# Patient Record
Sex: Female | Born: 1950 | Race: White | Hispanic: No | Smoking: Former smoker
Health system: Southern US, Community
[De-identification: ages and names within clinical notes are randomized; demographics above are authoritative.]

## PROBLEM LIST (undated history)

## (undated) DIAGNOSIS — I219 Acute myocardial infarction, unspecified: Secondary | ICD-10-CM

## (undated) DIAGNOSIS — T7840XA Allergy, unspecified, initial encounter: Secondary | ICD-10-CM

## (undated) DIAGNOSIS — E042 Nontoxic multinodular goiter: Secondary | ICD-10-CM

## (undated) DIAGNOSIS — I471 Supraventricular tachycardia, unspecified: Secondary | ICD-10-CM

## (undated) DIAGNOSIS — T8859XA Other complications of anesthesia, initial encounter: Secondary | ICD-10-CM

## (undated) DIAGNOSIS — M1990 Unspecified osteoarthritis, unspecified site: Secondary | ICD-10-CM

## (undated) DIAGNOSIS — M810 Age-related osteoporosis without current pathological fracture: Secondary | ICD-10-CM

## (undated) DIAGNOSIS — C801 Malignant (primary) neoplasm, unspecified: Secondary | ICD-10-CM

## (undated) HISTORY — DX: Supraventricular tachycardia: I47.1

## (undated) HISTORY — PX: TONSILLECTOMY: SUR1361

## (undated) HISTORY — PX: MOHS SURGERY: SUR867

## (undated) HISTORY — DX: Supraventricular tachycardia, unspecified: I47.10

## (undated) HISTORY — DX: Unspecified osteoarthritis, unspecified site: M19.90

## (undated) HISTORY — DX: Allergy, unspecified, initial encounter: T78.40XA

## (undated) HISTORY — PX: COLONOSCOPY: SHX174

## (undated) HISTORY — DX: Age-related osteoporosis without current pathological fracture: M81.0

## (undated) HISTORY — PX: POLYPECTOMY: SHX149

## (undated) HISTORY — DX: Nontoxic multinodular goiter: E04.2

---

## 1998-02-18 ENCOUNTER — Other Ambulatory Visit: Admission: RE | Admit: 1998-02-18 | Discharge: 1998-02-18 | Payer: Self-pay | Admitting: *Deleted

## 1999-10-24 ENCOUNTER — Other Ambulatory Visit: Admission: RE | Admit: 1999-10-24 | Discharge: 1999-10-24 | Payer: Self-pay | Admitting: *Deleted

## 2000-10-04 ENCOUNTER — Encounter: Payer: Self-pay | Admitting: Family Medicine

## 2000-10-04 ENCOUNTER — Encounter: Admission: RE | Admit: 2000-10-04 | Discharge: 2000-10-04 | Payer: Self-pay | Admitting: Family Medicine

## 2005-06-07 ENCOUNTER — Other Ambulatory Visit: Admission: RE | Admit: 2005-06-07 | Discharge: 2005-06-07 | Payer: Self-pay | Admitting: Obstetrics and Gynecology

## 2007-06-05 ENCOUNTER — Ambulatory Visit: Payer: Self-pay | Admitting: Gastroenterology

## 2007-06-16 ENCOUNTER — Ambulatory Visit: Payer: Self-pay | Admitting: Gastroenterology

## 2007-12-16 ENCOUNTER — Encounter: Admission: RE | Admit: 2007-12-16 | Discharge: 2007-12-16 | Payer: Self-pay | Admitting: Obstetrics and Gynecology

## 2008-01-07 ENCOUNTER — Other Ambulatory Visit: Admission: RE | Admit: 2008-01-07 | Discharge: 2008-01-07 | Payer: Self-pay | Admitting: Interventional Radiology

## 2008-01-07 ENCOUNTER — Encounter: Admission: RE | Admit: 2008-01-07 | Discharge: 2008-01-07 | Payer: Self-pay | Admitting: Obstetrics and Gynecology

## 2008-01-07 ENCOUNTER — Encounter (INDEPENDENT_AMBULATORY_CARE_PROVIDER_SITE_OTHER): Payer: Self-pay | Admitting: Interventional Radiology

## 2009-09-10 ENCOUNTER — Ambulatory Visit: Payer: Self-pay | Admitting: Radiology

## 2009-09-10 ENCOUNTER — Emergency Department (HOSPITAL_BASED_OUTPATIENT_CLINIC_OR_DEPARTMENT_OTHER): Admission: EM | Admit: 2009-09-10 | Discharge: 2009-09-10 | Payer: Self-pay | Admitting: Emergency Medicine

## 2010-06-19 LAB — CBC
HCT: 47.1 % — ABNORMAL HIGH (ref 36.0–46.0)
Hemoglobin: 16.5 g/dL — ABNORMAL HIGH (ref 12.0–15.0)
MCHC: 35 g/dL (ref 30.0–36.0)
MCV: 102.1 fL — ABNORMAL HIGH (ref 78.0–100.0)
Platelets: 223 10*3/uL (ref 150–400)
RBC: 4.61 MIL/uL (ref 3.87–5.11)
RDW: 12 % (ref 11.5–15.5)
WBC: 7.3 10*3/uL (ref 4.0–10.5)

## 2010-06-19 LAB — BASIC METABOLIC PANEL
BUN: 14 mg/dL (ref 6–23)
CO2: 25 mEq/L (ref 19–32)
Calcium: 9.9 mg/dL (ref 8.4–10.5)
Chloride: 105 mEq/L (ref 96–112)
Creatinine, Ser: 0.6 mg/dL (ref 0.4–1.2)
GFR calc Af Amer: >60 mL/min (ref >60)
GFR calc non Af Amer: >60 mL/min (ref >60)
Glucose, Bld: 140 mg/dL — ABNORMAL HIGH (ref 70–99)
Potassium: 4.6 mEq/L (ref 3.5–5.1)
Sodium: 143 mEq/L (ref 135–145)

## 2010-06-19 LAB — POCT CARDIAC MARKERS
CKMB, poc: 1.8 ng/mL (ref 1.0–8.0)
Troponin i, poc: <0.05 ng/mL (ref 0.00–0.09)

## 2010-06-19 LAB — DIFFERENTIAL
Basophils Absolute: 0.1 10*3/uL (ref 0.0–0.1)
Eosinophils Relative: 1 % (ref 0–5)
Lymphocytes Relative: 36 % (ref 12–46)
Lymphs Abs: 2.6 10*3/uL (ref 0.7–4.0)
Neutro Abs: 4 10*3/uL (ref 1.7–7.7)

## 2011-02-14 ENCOUNTER — Other Ambulatory Visit: Payer: Self-pay | Admitting: Obstetrics and Gynecology

## 2013-07-07 ENCOUNTER — Telehealth: Payer: Self-pay | Admitting: Gastroenterology

## 2013-07-07 ENCOUNTER — Encounter: Payer: Self-pay | Admitting: Gastroenterology

## 2013-07-07 NOTE — Telephone Encounter (Signed)
Per procedure note, she is a 5 year recall so she is due for colonoscopy. Please, schedule patient.

## 2013-07-07 NOTE — Telephone Encounter (Signed)
Left a message for Lillie Columbia to help with last procedure note.

## 2013-07-07 NOTE — Telephone Encounter (Signed)
Pt scheduled with Dr.Jacobs

## 2013-07-08 NOTE — Telephone Encounter (Signed)
Pathology report has been scanned and is available for view.  According to pathology report pt had hyperplastic polyps and is not due for colonoscopy until 2019.  Pt notified.  Colonoscopy for 07/21/13 cancelled.  Recall for 2019 entered into EPIC

## 2013-07-21 ENCOUNTER — Encounter: Payer: Self-pay | Admitting: Gastroenterology

## 2014-02-12 ENCOUNTER — Ambulatory Visit (INDEPENDENT_AMBULATORY_CARE_PROVIDER_SITE_OTHER): Payer: BC Managed Care – PPO | Admitting: Cardiology

## 2014-02-12 ENCOUNTER — Encounter: Payer: Self-pay | Admitting: Cardiology

## 2014-02-12 ENCOUNTER — Encounter: Payer: Self-pay | Admitting: *Deleted

## 2014-02-12 VITALS — BP 150/85 | HR 62 | Ht 66.5 in | Wt 103.2 lb

## 2014-02-12 DIAGNOSIS — R002 Palpitations: Secondary | ICD-10-CM

## 2014-02-12 DIAGNOSIS — I471 Supraventricular tachycardia: Secondary | ICD-10-CM

## 2014-02-12 DIAGNOSIS — Z720 Tobacco use: Secondary | ICD-10-CM

## 2014-02-12 DIAGNOSIS — Z87891 Personal history of nicotine dependence: Secondary | ICD-10-CM | POA: Insufficient documentation

## 2014-02-12 MED ORDER — METOPROLOL SUCCINATE ER 25 MG PO TB24: 25.0000 mg | ORAL_TABLET | Freq: Every day | ORAL | Status: DC

## 2014-02-12 NOTE — Progress Notes (Signed)
     HPI: 63 year old female for evaluation of palpitations. The patient was seen by Dr. Rollene Fare in June 2011 following an episode of palpitations requiring evaluation in the emergency room. There were apparently no rhythm strips available and he felt as though she may be having SVT. Echocardiogram in July 2011 showed normal LV function. There was mild tricuspid regurgitation and trace mitral regurgitation. Nuclear study in July 2011 showed an ejection fraction of 72%. There was no ischemia or infarction. Patient has had 2 episodes of heart racing similar to her previous event in the past 4 years. However recently she notes an elevated heart rate and also feeling her heart beat. She denies dyspnea on exertion, orthopnea, PND, pedal edema, syncope or chest pain.  Current Outpatient Prescriptions  Medication Sig Dispense Refill  . aspirin 81 MG tablet Take 81 mg by mouth daily.    . Calcium Carbonate-Vitamin D (CALTRATE 600+D PO) Take 1 capsule by mouth daily.    . Coenzyme Q10 (COQ10 PO) Take 10 mLs by mouth daily.    . metoprolol tartrate (LOPRESSOR) 25 MG tablet Take 25 mg by mouth as needed.    . Milk Thistle 200 MG CAPS Take 1 capsule by mouth 2 (two) times daily.    . Multiple Vitamins-Minerals (CENTRUM SILVER ADULT 50+ PO) Take 1 tablet by mouth daily.    . Omega-3 Fatty Acids (FISH OIL) 1200 MG CAPS Take 1 capsule by mouth daily.     No current facility-administered medications for this visit.    No Known Allergies   Past Medical History  Diagnosis Date  . Multiple thyroid nodules   . SVT (supraventricular tachycardia)     Past Surgical History  Procedure Laterality Date  . Tonsillectomy      History   Social History  . Marital Status: Married    Spouse Name: N/A    Number of Children: N/A  . Years of Education: N/A   Occupational History  . Not on file.   Social History Main Topics  . Smoking status: Current Some Day Smoker  . Smokeless tobacco: Not on file  .  Alcohol Use: 0.0 oz/week    0 Not specified per week     Comment: 2 glasses per night  . Drug Use: Not on file  . Sexual Activity: Not on file   Other Topics Concern  . Not on file   Social History Narrative  . No narrative on file    Family History  Problem Relation Age of Onset  . Heart disease Mother     Stents    ROS: no fevers or chills, productive cough, hemoptysis, dysphasia, odynophagia, melena, hematochezia, dysuria, hematuria, rash, seizure activity, orthopnea, PND, pedal edema, claudication. Remaining systems are negative.  Physical Exam:   Blood pressure 150/85, pulse 62, height 5' 6.5" (1.689 m), weight 103 lb 3.2 oz (46.811 kg).  General:  Well developed/well nourished in NAD Skin warm/dry Patient not depressed No peripheral clubbing Back-normal HEENT-normal/normal eyelids Neck supple/normal carotid upstroke bilaterally; no bruits; no JVD; no thyromegaly chest - Diminished BS CV - RRR/normal S1 and S2; no murmurs, rubs or gallops;  PMI nondisplaced Abdomen -NT/ND, no HSM, no mass, + bowel sounds, no bruit 2+ femoral pulses, no bruits Ext-no edema, chords, 2+ DP Neuro-grossly nonfocal  ECG NSR with no ST changes, Occasional PAC.

## 2014-02-12 NOTE — Assessment & Plan Note (Signed)
Add Toprol 25 mg daily.

## 2014-02-12 NOTE — Assessment & Plan Note (Signed)
Patient counseled on discontinuing. 

## 2014-02-12 NOTE — Patient Instructions (Signed)
Your physician recommends that you schedule a follow-up appointment in: [place] has requested that you have an echocardiogram. Echocardiography is a painless test that uses sound waves to create images of your heart. It provides your doctor with information about the size and shape of your heart and how well your heart's chambers and valves are working. This procedure takes approximately one hour. There are no restrictions for this procedure.   Your physician recommends that you HAVE LAB WORK TODAY  START METOPROLOL SUCC ER 25 MG ONCE DAILY AT BEDTIME

## 2014-02-12 NOTE — Assessment & Plan Note (Signed)
Etiology of symptoms not completely clear. Her symptoms at present are not similar to her previous SVT symptoms. Her SVT symptoms appear to be rare. I will add Toprol 25 mg daily. Check TSH and potassium. Repeat echocardiogram. I will see her back in 12 weeks. If her symptoms persist despite the addition of Toprol we will plan a monitor to further assess. If SVT demonstrated and continues despite beta-blockade we could consider ablation.

## 2014-02-13 LAB — BASIC METABOLIC PANEL WITH GFR
BUN: 19 mg/dL (ref 6–23)
CO2: 29 meq/L (ref 19–32)
Calcium: 10.1 mg/dL (ref 8.4–10.5)
Chloride: 102 mEq/L (ref 96–112)
Creat: 0.79 mg/dL (ref 0.50–1.10)
GFR, Est African American: >89 mL/min
GFR, Est Non African American: 79 mL/min
GLUCOSE: 98 mg/dL (ref 70–99)
POTASSIUM: 4.5 meq/L (ref 3.5–5.3)
SODIUM: 139 meq/L (ref 135–145)

## 2014-02-13 LAB — TSH: TSH: 4.257 u[IU]/mL (ref 0.350–4.500)

## 2014-02-17 ENCOUNTER — Ambulatory Visit (HOSPITAL_COMMUNITY): Payer: BC Managed Care – PPO

## 2014-03-12 DIAGNOSIS — Z1289 Encounter for screening for malignant neoplasm of other sites: Secondary | ICD-10-CM | POA: Diagnosis not present

## 2014-03-12 DIAGNOSIS — Z1231 Encounter for screening mammogram for malignant neoplasm of breast: Secondary | ICD-10-CM | POA: Diagnosis not present

## 2014-03-12 DIAGNOSIS — Z01419 Encounter for gynecological examination (general) (routine) without abnormal findings: Secondary | ICD-10-CM | POA: Diagnosis not present

## 2014-03-16 ENCOUNTER — Ambulatory Visit (HOSPITAL_COMMUNITY)
Admission: RE | Admit: 2014-03-16 | Discharge: 2014-03-16 | Disposition: A | Discharge status: Home / Self Care | Payer: Medicare Other | Source: Ambulatory Visit | Attending: Cardiology | Admitting: Cardiology

## 2014-03-16 DIAGNOSIS — I471 Supraventricular tachycardia: Secondary | ICD-10-CM | POA: Diagnosis not present

## 2014-03-16 DIAGNOSIS — I4949 Other premature depolarization: Secondary | ICD-10-CM | POA: Diagnosis not present

## 2014-03-16 DIAGNOSIS — R002 Palpitations: Secondary | ICD-10-CM

## 2014-03-16 NOTE — Progress Notes (Signed)
2D Echo Performed 03/16/2014    Matthew Cina, RCS  

## 2014-04-21 DIAGNOSIS — D440 Neoplasm of uncertain behavior of thyroid gland: Secondary | ICD-10-CM | POA: Diagnosis not present

## 2014-04-22 ENCOUNTER — Other Ambulatory Visit (INDEPENDENT_AMBULATORY_CARE_PROVIDER_SITE_OTHER): Payer: Self-pay | Admitting: Otolaryngology

## 2014-04-22 DIAGNOSIS — E041 Nontoxic single thyroid nodule: Secondary | ICD-10-CM

## 2014-04-29 ENCOUNTER — Ambulatory Visit (HOSPITAL_COMMUNITY): Payer: Medicare Other

## 2014-05-03 ENCOUNTER — Ambulatory Visit (HOSPITAL_COMMUNITY)
Admission: RE | Admit: 2014-05-03 | Discharge: 2014-05-03 | Disposition: A | Discharge status: Home / Self Care | Payer: Medicare Other | Source: Ambulatory Visit | Attending: Otolaryngology | Admitting: Otolaryngology

## 2014-05-03 DIAGNOSIS — E041 Nontoxic single thyroid nodule: Secondary | ICD-10-CM | POA: Diagnosis present

## 2014-05-03 DIAGNOSIS — E042 Nontoxic multinodular goiter: Secondary | ICD-10-CM | POA: Insufficient documentation

## 2014-05-20 NOTE — Progress Notes (Signed)
      HPI: FU palpitations. The patient was seen by Dr. Rollene Fare in June 2011 following an episode of palpitations requiring evaluation in the emergency room. There were apparently no rhythm strips available and he felt as though she may be having SVT. Nuclear study in July 2011 showed an ejection fraction of 72%. There was no ischemia or infarction. Echo 12/15 showed normal LV function. Since last seen,  Her palpitations have completely resolved. She denies dyspnea, chest pain, palpitations or syncope.  Current Outpatient Prescriptions  Medication Sig Dispense Refill  . aspirin 81 MG tablet Take 81 mg by mouth daily.    . Calcium Carbonate-Vitamin D (CALTRATE 600+D PO) Take 1 capsule by mouth daily.    . Coenzyme Q10 (COQ10 PO) Take 10 mLs by mouth daily.    . metoprolol succinate (TOPROL XL) 25 MG 24 hr tablet Take 1 tablet (25 mg total) by mouth daily. 90 tablet 3  . Milk Thistle 200 MG CAPS Take 1 capsule by mouth 2 (two) times daily.    . Multiple Vitamins-Minerals (CENTRUM SILVER ADULT 50+ PO) Take 1 tablet by mouth daily.    . Omega-3 Fatty Acids (FISH OIL) 1200 MG CAPS Take 1 capsule by mouth daily.     No current facility-administered medications for this visit.     Past Medical History  Diagnosis Date  . Multiple thyroid nodules   . SVT (supraventricular tachycardia)     Past Surgical History  Procedure Laterality Date  . Tonsillectomy      History   Social History  . Marital Status: Married    Spouse Name: N/A  . Number of Children: N/A  . Years of Education: N/A   Occupational History  . Not on file.   Social History Main Topics  . Smoking status: Current Some Day Smoker  . Smokeless tobacco: Not on file  . Alcohol Use: 0.0 oz/week    0 Standard drinks or equivalent per week     Comment: 2 glasses per night  . Drug Use: Not on file  . Sexual Activity: Not on file   Other Topics Concern  . Not on file   Social History Narrative    ROS: no fevers or  chills, productive cough, hemoptysis, dysphasia, odynophagia, melena, hematochezia, dysuria, hematuria, rash, seizure activity, orthopnea, PND, pedal edema, claudication. Remaining systems are negative.  Physical Exam: Well-developed well-nourished in no acute distress.  Skin is warm and dry.  HEENT is normal.  Neck is supple.  Chest is clear to auscultation with normal expansion.  Cardiovascular exam is regular rate and rhythm.  Abdominal exam nontender or distended. No masses palpated. Positive bruit Extremities show no edema. neuro grossly intact

## 2014-05-21 ENCOUNTER — Ambulatory Visit (INDEPENDENT_AMBULATORY_CARE_PROVIDER_SITE_OTHER): Payer: Medicare Other | Admitting: Cardiology

## 2014-05-21 ENCOUNTER — Encounter: Payer: Self-pay | Admitting: *Deleted

## 2014-05-21 ENCOUNTER — Encounter: Payer: Self-pay | Admitting: Cardiology

## 2014-05-21 VITALS — BP 138/78 | HR 68 | Ht 66.5 in | Wt 103.7 lb

## 2014-05-21 DIAGNOSIS — R0989 Other specified symptoms and signs involving the circulatory and respiratory systems: Secondary | ICD-10-CM

## 2014-05-21 DIAGNOSIS — I471 Supraventricular tachycardia: Secondary | ICD-10-CM

## 2014-05-21 DIAGNOSIS — Z720 Tobacco use: Secondary | ICD-10-CM | POA: Diagnosis not present

## 2014-05-21 NOTE — Assessment & Plan Note (Signed)
Schedule ultrasound to exclude aneurysm. 

## 2014-05-21 NOTE — Assessment & Plan Note (Signed)
Symptoms have resolved. Continue beta blocker. We will consider a monitor in the future if her symptoms recur.

## 2014-05-21 NOTE — Assessment & Plan Note (Signed)
Patient counseled on discontinuing. 

## 2014-05-21 NOTE — Assessment & Plan Note (Signed)
Continue beta blocker. 

## 2014-05-21 NOTE — Patient Instructions (Signed)
Your physician wants you to follow-up in: ONE YEAR WITH DR CRENSHAW You will receive a reminder letter in the mail two months in advance. If you don't receive a letter, please call our office to schedule the follow-up appointment.   Your physician has requested that you have an abdominal aorta duplex. During this test, an ultrasound is used to evaluate the aorta. Allow 30 minutes for this exam. Do not eat after midnight the day before and avoid carbonated beverages  

## 2014-06-09 ENCOUNTER — Ambulatory Visit (HOSPITAL_COMMUNITY)
Admission: RE | Admit: 2014-06-09 | Discharge: 2014-06-09 | Disposition: A | Discharge status: Home / Self Care | Payer: Medicare Other | Source: Ambulatory Visit | Attending: Internal Medicine | Admitting: Internal Medicine

## 2014-06-09 DIAGNOSIS — R0989 Other specified symptoms and signs involving the circulatory and respiratory systems: Secondary | ICD-10-CM

## 2014-06-09 NOTE — Progress Notes (Signed)
Abdominal Aortic Duplex Completed °Brianna L Mazza,RVT °

## 2014-06-14 ENCOUNTER — Encounter: Payer: Self-pay | Admitting: Cardiology

## 2014-06-14 NOTE — Telephone Encounter (Signed)
Pt is returning Debra's call in reference to some results to her ultrasound. Please f/u  Thanks

## 2014-06-14 NOTE — Telephone Encounter (Signed)
This encounter was created in error - please disregard.

## 2014-10-21 DIAGNOSIS — L821 Other seborrheic keratosis: Secondary | ICD-10-CM | POA: Diagnosis not present

## 2015-01-28 ENCOUNTER — Other Ambulatory Visit: Payer: Self-pay | Admitting: Cardiology

## 2015-04-06 ENCOUNTER — Other Ambulatory Visit (INDEPENDENT_AMBULATORY_CARE_PROVIDER_SITE_OTHER): Payer: Self-pay | Admitting: Otolaryngology

## 2015-04-06 DIAGNOSIS — E041 Nontoxic single thyroid nodule: Secondary | ICD-10-CM

## 2015-04-06 DIAGNOSIS — D440 Neoplasm of uncertain behavior of thyroid gland: Secondary | ICD-10-CM | POA: Diagnosis not present

## 2015-04-08 ENCOUNTER — Ambulatory Visit
Admission: RE | Admit: 2015-04-08 | Discharge: 2015-04-08 | Disposition: A | Discharge status: Home / Self Care | Payer: Medicare Other | Source: Ambulatory Visit | Attending: Otolaryngology | Admitting: Otolaryngology

## 2015-04-08 DIAGNOSIS — E042 Nontoxic multinodular goiter: Secondary | ICD-10-CM | POA: Diagnosis not present

## 2015-04-08 DIAGNOSIS — E041 Nontoxic single thyroid nodule: Secondary | ICD-10-CM

## 2015-04-11 DIAGNOSIS — Z1231 Encounter for screening mammogram for malignant neoplasm of breast: Secondary | ICD-10-CM | POA: Diagnosis not present

## 2015-04-11 DIAGNOSIS — Z1289 Encounter for screening for malignant neoplasm of other sites: Secondary | ICD-10-CM | POA: Diagnosis not present

## 2015-04-19 DIAGNOSIS — M810 Age-related osteoporosis without current pathological fracture: Secondary | ICD-10-CM | POA: Diagnosis not present

## 2015-04-19 DIAGNOSIS — M8588 Other specified disorders of bone density and structure, other site: Secondary | ICD-10-CM | POA: Diagnosis not present

## 2015-05-18 DIAGNOSIS — M810 Age-related osteoporosis without current pathological fracture: Secondary | ICD-10-CM | POA: Insufficient documentation

## 2015-05-30 NOTE — Progress Notes (Signed)
      HPI: FU palpitations. The patient was seen by Dr. Rollene Fare in June 2011 following an episode of palpitations requiring evaluation in the emergency room. There were apparently no rhythm strips available and he felt as though she may be having SVT. Nuclear study in July 2011 showed an ejection fraction of 72%. There was no ischemia or infarction. Echo 12/15 showed normal LV function. Abdominal ultrasound March 2016 showed no aneurysm. Since last seen,the patient denies any dyspnea on exertion, orthopnea, PND, pedal edema, palpitations, syncope or chest pain.   Current Outpatient Prescriptions  Medication Sig Dispense Refill  . aspirin 81 MG tablet Take 81 mg by mouth daily.    . Calcium Carbonate-Vitamin D (CALTRATE 600+D PO) Take 1 capsule by mouth daily.    . Coenzyme Q10 (COQ10 PO) Take 10 mLs by mouth daily.    . metoprolol succinate (TOPROL-XL) 25 MG 24 hr tablet TAKE 1 TABLET BY MOUTH DAILY 90 tablet 1  . Milk Thistle 200 MG CAPS Take 1 capsule by mouth 2 (two) times daily.    . Multiple Vitamins-Minerals (CENTRUM SILVER ADULT 50+ PO) Take 1 tablet by mouth daily.    . Omega-3 Fatty Acids (FISH OIL) 1200 MG CAPS Take 1 capsule by mouth daily.    . Turmeric 500 MG TABS Take 1 tablet by mouth daily.     No current facility-administered medications for this visit.     Past Medical History  Diagnosis Date  . Multiple thyroid nodules   . SVT (supraventricular tachycardia) [facility])     Past Surgical History  Procedure Laterality Date  . Tonsillectomy      Social History   Social History  . Marital Status: Married    Spouse Name: N/A  . Number of Children: N/A  . Years of Education: N/A   Occupational History  . Not on file.   Social History Main Topics  . Smoking status: Current Some Day Smoker  . Smokeless tobacco: Not on file  . Alcohol Use: 0.0 oz/week    0 Standard drinks or equivalent per week     Comment: 2 glasses per night  . Drug Use: Not on file  .  Sexual Activity: Not on file   Other Topics Concern  . Not on file   Social History Narrative    Family History  Problem Relation Age of Onset  . Heart disease Mother     Stents    ROS: no fevers or chills, productive cough, hemoptysis, dysphasia, odynophagia, melena, hematochezia, dysuria, hematuria, rash, seizure activity, orthopnea, PND, pedal edema, claudication. Remaining systems are negative.  Physical Exam: Well-developed well-nourished in no acute distress.  Skin is warm and dry.  HEENT is normal.  Neck is supple.  Chest diminished BS  Cardiovascular exam is regular rate and rhythm.  Abdominal exam nontender or distended. No masses palpated. Extremities show no edema. neuro grossly intact  ECG Sinus rhythm with PACs. Cannot rule out septal infarct.

## 2015-05-31 ENCOUNTER — Encounter: Payer: Self-pay | Admitting: Cardiology

## 2015-05-31 ENCOUNTER — Ambulatory Visit (INDEPENDENT_AMBULATORY_CARE_PROVIDER_SITE_OTHER): Payer: Medicare Other | Admitting: Cardiology

## 2015-05-31 VITALS — BP 130/78 | HR 54 | Ht 66.5 in | Wt 107.6 lb

## 2015-05-31 DIAGNOSIS — R002 Palpitations: Secondary | ICD-10-CM

## 2015-05-31 DIAGNOSIS — Z720 Tobacco use: Secondary | ICD-10-CM

## 2015-05-31 MED ORDER — METOPROLOL SUCCINATE ER 25 MG PO TB24: 25.0000 mg | ORAL_TABLET | Freq: Every day | ORAL | Status: DC

## 2015-05-31 NOTE — Assessment & Plan Note (Signed)
Symptoms controlled. Continue beta blocker.

## 2015-05-31 NOTE — Assessment & Plan Note (Signed)
Continue beta blocker. 

## 2015-05-31 NOTE — Assessment & Plan Note (Signed)
Patient counseled on discontinuing. 

## 2015-05-31 NOTE — Patient Instructions (Signed)
Your physician wants you to follow-up in: ONE YEAR WITH DR CRENSHAW You will receive a reminder letter in the mail two months in advance. If you don't receive a letter, please call our office to schedule the follow-up appointment.   If you need a refill on your cardiac medications before your next appointment, please call your pharmacy.  

## 2015-07-01 DIAGNOSIS — H524 Presbyopia: Secondary | ICD-10-CM | POA: Diagnosis not present

## 2015-07-01 DIAGNOSIS — H2513 Age-related nuclear cataract, bilateral: Secondary | ICD-10-CM | POA: Diagnosis not present

## 2015-07-01 DIAGNOSIS — H04123 Dry eye syndrome of bilateral lacrimal glands: Secondary | ICD-10-CM | POA: Diagnosis not present

## 2015-07-01 DIAGNOSIS — H52203 Unspecified astigmatism, bilateral: Secondary | ICD-10-CM | POA: Diagnosis not present

## 2016-04-10 ENCOUNTER — Other Ambulatory Visit (INDEPENDENT_AMBULATORY_CARE_PROVIDER_SITE_OTHER): Payer: Self-pay | Admitting: Otolaryngology

## 2016-04-10 DIAGNOSIS — E041 Nontoxic single thyroid nodule: Secondary | ICD-10-CM

## 2016-04-13 ENCOUNTER — Ambulatory Visit
Admission: RE | Admit: 2016-04-13 | Discharge: 2016-04-13 | Disposition: A | Discharge status: Home / Self Care | Payer: Medicare Other | Source: Ambulatory Visit | Attending: Otolaryngology | Admitting: Otolaryngology

## 2016-04-13 DIAGNOSIS — E042 Nontoxic multinodular goiter: Secondary | ICD-10-CM | POA: Diagnosis not present

## 2016-04-13 DIAGNOSIS — E041 Nontoxic single thyroid nodule: Secondary | ICD-10-CM

## 2016-05-02 DIAGNOSIS — D440 Neoplasm of uncertain behavior of thyroid gland: Secondary | ICD-10-CM | POA: Diagnosis not present

## 2016-05-14 NOTE — Progress Notes (Signed)
      HPI: FU palpitations. The patient was seen by Dr. Rollene Fare in June 2011 following an episode of palpitations requiring evaluation in the emergency room. There were apparently no rhythm strips available and he felt as though she may be having SVT. Nuclear study in July 2011 showed an ejection fraction of 72%. There was no ischemia or infarction. Echo 12/15 showed normal LV function. Abdominal ultrasound March 2016 showed no aneurysm. Since last seen,the patient has dyspnea with more extreme activities but not with routine activities. It is relieved with rest. It is not associated with chest pain. There is no orthopnea, PND or pedal edema. There is no syncope or palpitations. There is no exertional chest pain.   Current Outpatient Prescriptions  Medication Sig Dispense Refill  . alendronate (FOSAMAX) 70 MG tablet Take 1 tablet by mouth once a week.  3  . aspirin 81 MG tablet Take 81 mg by mouth daily.    . Calcium Carbonate-Vitamin D (CALTRATE 600+D PO) Take 1 capsule by mouth daily.    . Coenzyme Q10 (COQ10 PO) Take 10 mLs by mouth daily.    . metoprolol succinate (TOPROL-XL) 25 MG 24 hr tablet Take 1 tablet (25 mg total) by mouth daily. 90 tablet 3  . Milk Thistle 200 MG CAPS Take 1 capsule by mouth 2 (two) times daily.    . Multiple Vitamins-Minerals (CENTRUM SILVER ADULT 50+ PO) Take 1 tablet by mouth daily.    . Omega-3 Fatty Acids (FISH OIL) 1200 MG CAPS Take 1 capsule by mouth daily.     No current facility-administered medications for this visit.      Past Medical History:  Diagnosis Date  . Multiple thyroid nodules   . SVT (supraventricular tachycardia) (HCC)     Past Surgical History:  Procedure Laterality Date  . TONSILLECTOMY      Social History   Social History  . Marital status: Married    Spouse name: N/A  . Number of children: N/A  . Years of education: N/A   Occupational History  . Not on file.   Social History Main Topics  . Smoking status: Current  Some Day Smoker  . Smokeless tobacco: Never Used  . Alcohol use 0.0 oz/week     Comment: 2 glasses per night  . Drug use: Unknown  . Sexual activity: Not on file   Other Topics Concern  . Not on file   Social History Narrative  . No narrative on file    Family History  Problem Relation Age of Onset  . Heart disease Mother     Stents    ROS: no fevers or chills, productive cough, hemoptysis, dysphasia, odynophagia, melena, hematochezia, dysuria, hematuria, rash, seizure activity, orthopnea, PND, pedal edema, claudication. Remaining systems are negative.  Physical Exam: Well-developed well-nourished thin in no acute distress.  Skin is warm and dry.  HEENT is normal.  Neck is supple.  Chest is clear to auscultation with normal expansion.  Cardiovascular exam is regular rate and rhythm.  Abdominal exam nontender or distended. No masses palpated. Extremities show no edema. neuro grossly intact  ECG-sinus bradycardia with occasional PVCs. Cannot rule out prior septal infarct.  A/P  1 palpitations-symptoms seem reasonably well controlled. Continue beta blocker.  2 supraventricular tachycardia-symptoms also control. Continue beta blocker.  3 tobacco abuse-patient counseled on discontinuing.  Kirk Ruths, MD

## 2016-05-23 ENCOUNTER — Ambulatory Visit (INDEPENDENT_AMBULATORY_CARE_PROVIDER_SITE_OTHER): Payer: Medicare Other | Admitting: Cardiology

## 2016-05-23 ENCOUNTER — Encounter: Payer: Self-pay | Admitting: Cardiology

## 2016-05-23 VITALS — BP 126/66 | HR 56 | Ht 66.0 in | Wt 103.0 lb

## 2016-05-23 DIAGNOSIS — Z720 Tobacco use: Secondary | ICD-10-CM

## 2016-05-23 DIAGNOSIS — I471 Supraventricular tachycardia: Secondary | ICD-10-CM | POA: Diagnosis not present

## 2016-05-23 DIAGNOSIS — R002 Palpitations: Secondary | ICD-10-CM | POA: Diagnosis not present

## 2016-05-23 MED ORDER — METOPROLOL SUCCINATE ER 25 MG PO TB24: 25.0000 mg | ORAL_TABLET | Freq: Every day | ORAL | 3 refills | Status: DC

## 2016-05-23 NOTE — Patient Instructions (Signed)
Your physician wants you to follow-up in: ONE YEAR WITH DR CRENSHAW You will receive a reminder letter in the mail two months in advance. If you don't receive a letter, please call our office to schedule the follow-up appointment.   If you need a refill on your cardiac medications before your next appointment, please call your pharmacy.  

## 2016-08-03 ENCOUNTER — Other Ambulatory Visit: Payer: Self-pay | Admitting: Obstetrics and Gynecology

## 2016-08-03 DIAGNOSIS — Z1231 Encounter for screening mammogram for malignant neoplasm of breast: Secondary | ICD-10-CM | POA: Diagnosis not present

## 2016-08-03 DIAGNOSIS — Z124 Encounter for screening for malignant neoplasm of cervix: Secondary | ICD-10-CM | POA: Diagnosis not present

## 2016-08-06 LAB — CYTOLOGY - PAP

## 2017-06-10 ENCOUNTER — Encounter: Payer: Self-pay | Admitting: *Deleted

## 2017-06-11 ENCOUNTER — Encounter: Payer: Self-pay | Admitting: Cardiology

## 2017-06-11 ENCOUNTER — Ambulatory Visit: Payer: Medicare HMO | Admitting: Cardiology

## 2017-06-11 VITALS — BP 142/68 | HR 51 | Ht 66.5 in | Wt 103.4 lb

## 2017-06-11 DIAGNOSIS — R002 Palpitations: Secondary | ICD-10-CM | POA: Diagnosis not present

## 2017-06-11 DIAGNOSIS — R030 Elevated blood-pressure reading, without diagnosis of hypertension: Secondary | ICD-10-CM

## 2017-06-11 NOTE — Patient Instructions (Signed)
Medication Instructions:  Your physician recommends that you continue on your current medications as directed. Please refer to the Current Medication list given to you today.   Labwork: none  Testing/Procedures: none  Follow-Up: Your physician wants you to follow-up in: 12 months with Dr. Stanford Breed. You will receive a reminder letter in the mail two months in advance. If you don't receive a letter, please call our office to schedule the follow-up appointment.   Any Other Special Instructions Will Be Listed Below (If Applicable).     If you need a refill on your cardiac medications before your next appointment, please call your pharmacy.

## 2017-06-11 NOTE — Progress Notes (Signed)
HPI: FU palpitations. The patient was seen by Dr. Rollene Fare in June 2011 following an episode of palpitations requiring evaluation in the emergency room. There were apparently no rhythm strips available and he felt as though she may be having SVT. Nuclear study in July 2011 showed an ejection fraction of 72%. There was no ischemia or infarction. Echo 12/15 showed normal LV function. Abdominal ultrasound March 2016 showed no aneurysm. Since last seen,she denies dyspnea, chest pain or syncope.  Occasional palpitations that are short in duration.  Current Outpatient Medications  Medication Sig Dispense Refill  . alendronate (FOSAMAX) 70 MG tablet Take 1 tablet by mouth once a week.  3  . aspirin 81 MG tablet Take 81 mg by mouth daily.    . Calcium Carbonate-Vitamin D (CALTRATE 600+D PO) Take 1 capsule by mouth daily.    . Coenzyme Q10 (COQ10 PO) Take 10 mLs by mouth daily.    . metoprolol succinate (TOPROL-XL) 25 MG 24 hr tablet Take 1 tablet (25 mg total) by mouth daily. 90 tablet 3  . Milk Thistle 200 MG CAPS Take 1 capsule by mouth 2 (two) times daily.    . Multiple Vitamins-Minerals (CENTRUM SILVER ADULT 50+ PO) Take 1 tablet by mouth daily.    . Omega-3 Fatty Acids (FISH OIL) 1200 MG CAPS Take 1 capsule by mouth daily.     No current facility-administered medications for this visit.      Past Medical History:  Diagnosis Date  . Multiple thyroid nodules   . SVT (supraventricular tachycardia) (HCC)     Past Surgical History:  Procedure Laterality Date  . TONSILLECTOMY      Social History   Socioeconomic History  . Marital status: Married    Spouse name: Not on file  . Number of children: Not on file  . Years of education: Not on file  . Highest education level: Not on file  Social Needs  . Financial resource strain: Not on file  . Food insecurity - worry: Not on file  . Food insecurity - inability: Not on file  . Transportation needs - medical: Not on file  .  Transportation needs - non-medical: Not on file  Occupational History  . Not on file  Tobacco Use  . Smoking status: Former Smoker    Last attempt to quit: 04/08/2017    Years since quitting: 0.1  . Smokeless tobacco: Never Used  Substance and Sexual Activity  . Alcohol use: Yes    Alcohol/week: 0.0 oz    Comment: 2 glasses per night  . Drug use: Not on file  . Sexual activity: Not on file  Other Topics Concern  . Not on file  Social History Narrative  . Not on file    Family History  Problem Relation Age of Onset  . Heart disease Mother        Stents    ROS: no fevers or chills, productive cough, hemoptysis, dysphasia, odynophagia, melena, hematochezia, dysuria, hematuria, rash, seizure activity, orthopnea, PND, pedal edema, claudication. Remaining systems are negative.  Physical Exam: Well-developed well-nourished in no acute distress.  Skin is warm and dry.  HEENT is normal.  Neck is supple.  Chest is clear to auscultation with normal expansion.  Cardiovascular exam is regular rate and rhythm.  Abdominal exam nontender or distended. No masses palpated. Extremities show no edema. neuro grossly intact  ECG-sinus bradycardia, no ST changes.  Personally reviewed  A/P  1 palpitations-symptoms are well controlled.  Continue present dose of beta-blocker.  2 possible history of supraventricular tachycardia-continue beta-blocker.  3 tobacco abuse-patient has discontinued.  4 elevated blood pressure reading-she will follow her blood pressure at home and we will medications as needed.  Kirk Ruths, MD

## 2017-06-18 ENCOUNTER — Encounter: Payer: Self-pay | Admitting: Gastroenterology

## 2017-06-26 ENCOUNTER — Other Ambulatory Visit: Payer: Self-pay | Admitting: Cardiology

## 2017-06-26 DIAGNOSIS — R002 Palpitations: Secondary | ICD-10-CM

## 2017-07-01 ENCOUNTER — Encounter: Payer: Self-pay | Admitting: Gastroenterology

## 2017-08-20 ENCOUNTER — Other Ambulatory Visit: Payer: Self-pay

## 2017-08-20 ENCOUNTER — Ambulatory Visit (AMBULATORY_SURGERY_CENTER): Payer: Self-pay

## 2017-08-20 VITALS — Ht 66.5 in | Wt 103.6 lb

## 2017-08-20 DIAGNOSIS — Z1211 Encounter for screening for malignant neoplasm of colon: Secondary | ICD-10-CM

## 2017-08-20 NOTE — Progress Notes (Signed)
No egg or soy allergy known to patient  No issues with past sedation with any surgeries  or procedures, no intubation problems  No diet pills per patient No home 02 use per patient  No blood thinners per patient  Pt denies issues with constipation  No A fib or A flutter  EMMI video sent to pt's e mail , pt declined    

## 2017-08-21 ENCOUNTER — Telehealth: Payer: Self-pay | Admitting: Internal Medicine

## 2017-08-21 MED ORDER — PEG 3350-KCL-NA BICARB-NACL 420 G PO SOLR: 4000.0000 mL | Freq: Once | ORAL | 0 refills | Status: AC

## 2017-08-21 NOTE — Telephone Encounter (Signed)
Resent script to [facility]- called pt and notified her prep sent- Abigail PV

## 2017-08-21 NOTE — Telephone Encounter (Signed)
Pt called requesting prep for her proc as her pharmacy has not received it yet. She uses [facility] on [address] in [place].

## 2017-09-02 ENCOUNTER — Encounter: Payer: Self-pay | Admitting: Internal Medicine

## 2017-09-03 ENCOUNTER — Encounter: Payer: Medicare Other | Admitting: Gastroenterology

## 2017-09-10 ENCOUNTER — Encounter: Payer: Self-pay | Admitting: Internal Medicine

## 2017-09-10 ENCOUNTER — Ambulatory Visit (AMBULATORY_SURGERY_CENTER): Payer: Medicare HMO | Admitting: Internal Medicine

## 2017-09-10 ENCOUNTER — Other Ambulatory Visit: Payer: Self-pay

## 2017-09-10 VITALS — BP 129/53 | HR 49 | Temp 97.1°F | Resp 16 | Ht 66.5 in | Wt 103.0 lb

## 2017-09-10 DIAGNOSIS — Z1211 Encounter for screening for malignant neoplasm of colon: Secondary | ICD-10-CM | POA: Diagnosis not present

## 2017-09-10 DIAGNOSIS — D125 Benign neoplasm of sigmoid colon: Secondary | ICD-10-CM

## 2017-09-10 DIAGNOSIS — D120 Benign neoplasm of cecum: Secondary | ICD-10-CM

## 2017-09-10 DIAGNOSIS — D122 Benign neoplasm of ascending colon: Secondary | ICD-10-CM

## 2017-09-10 MED ORDER — SODIUM CHLORIDE 0.9 % IV SOLN: 500.0000 mL | Freq: Once | INTRAVENOUS | Status: DC

## 2017-09-10 NOTE — Progress Notes (Signed)
Pt's states no medical or surgical changes since previsit or office visit. 

## 2017-09-10 NOTE — Op Note (Signed)
[facility] Patient Name: Anita Donovan Procedure Date: 09/10/2017 10:49 AM MRN: [id number] Endoscopist: Jerene Bears , MD Age: 67 Referring MD:  Date of Birth: Aug 09, 1950 Gender: Female Account #: [id number] Procedure:                Colonoscopy Indications:              Screening for colorectal malignant neoplasm, Last                            colonoscopy 10 years ago Medicines:                Monitored Anesthesia Care Procedure:                Pre-Anesthesia Assessment:                           - Prior to the procedure, a History and Physical                            was performed, and patient medications and                            allergies were reviewed. The patient's tolerance of                            previous anesthesia was also reviewed. The risks                            and benefits of the procedure and the sedation                            options and risks were discussed with the patient.                            All questions were answered, and informed consent                            was obtained. Prior Anticoagulants: The patient has                            taken no previous anticoagulant or antiplatelet                            agents. ASA Grade Assessment: II - A patient with                            mild systemic disease. After reviewing the risks                            and benefits, the patient was deemed in                            satisfactory condition to undergo the procedure.  After obtaining informed consent, the colonoscope                            was passed under direct vision. Throughout the                            procedure, the patient's blood pressure, pulse, and                            oxygen saturations were monitored continuously. The                            Colonoscope was introduced through the anus and                            advanced to the cecum, identified by  appendiceal                            orifice and ileocecal valve. The patient tolerated                            the procedure well. The quality of the bowel                            preparation was good. The ileocecal valve,                            appendiceal orifice, and rectum were photographed.                            The colonoscopy was somewhat difficult due to a                            tortuous colon. Successful completion of the                            procedure was aided by applying abdominal pressure. Scope In: 10:55:42 AM Scope Out: 11:26:31 AM Scope Withdrawal Time: 0 hours 21 minutes 53 seconds  Total Procedure Duration: 0 hours 30 minutes 49 seconds  Findings:                 The digital rectal exam was normal.                           A 4 mm polyp was found in the cecum. The polyp was                            sessile. The polyp was removed with a cold snare.                            Resection and retrieval were complete.                           Two sessile polyps were  found in the ascending                            colon. The polyps were 4 to 5 mm in size. These                            polyps were removed with a cold snare. Resection                            and retrieval were complete.                           A 12 mm polyp was found in the sigmoid colon. The                            polyp was semi-pedunculated. The polyp was removed                            with a hot snare. Resection and retrieval were                            complete.                           Three sessile polyps were found in the sigmoid                            colon. The polyps were 4 to 6 mm in size. These                            polyps were removed with a cold snare. Resection                            and retrieval were complete.                           The exam was otherwise without abnormality on                            direct and  retroflexion views. Complications:            No immediate complications. Estimated Blood Loss:     Estimated blood loss was minimal. Impression:               - One 4 mm polyp in the cecum, removed with a cold                            snare. Resected and retrieved.                           - Two 4 to 5 mm polyps in the ascending colon,                            removed with a cold snare. Resected  and retrieved.                           - One 12 mm polyp in the sigmoid colon, removed                            with a hot snare. Resected and retrieved.                           - Three 4 to 6 mm polyps in the sigmoid colon,                            removed with a cold snare. Resected and retrieved.                           - The examination was otherwise normal on direct                            and retroflexion views. Recommendation:           - Patient has a contact number available for                            emergencies. The signs and symptoms of potential                            delayed complications were discussed with the                            patient. Return to normal activities tomorrow.                            Written discharge instructions were provided to the                            patient.                           - Resume previous diet.                           - Continue present medications.                           - No ibuprofen, naproxen, or other non-steroidal                            anti-inflammatory drugs for 2 weeks after polyp                            removal.                           - Await pathology results.                           -  Repeat colonoscopy is recommended. The                            colonoscopy date will be determined after pathology                            results from today's exam become available for                            review. Jerene Bears, MD 09/10/2017 11:34:58 AM This report has been signed  electronically.

## 2017-09-10 NOTE — Progress Notes (Signed)
Called to room to assist during endoscopic procedure.  Patient ID and intended procedure confirmed with present staff. Received instructions for my participation in the procedure from the performing physician.  

## 2017-09-10 NOTE — Patient Instructions (Signed)
No NSAIDS (Advil, Motrin, Ibuprofen, Aleve, Naprosyn, Etc. ) for two weeks, June 25,2019.  Handouts given : polyps.  YOU HAD AN ENDOSCOPIC PROCEDURE TODAY AT [facility]:   Refer to the procedure report that was given to you for any specific questions about what was found during the examination.  If the procedure report does not answer your questions, please call your gastroenterologist to clarify.  If you requested that your care partner not be given the details of your procedure findings, then the procedure report has been included in a sealed envelope for you to review at your convenience later.  YOU SHOULD EXPECT: Some feelings of bloating in the abdomen. Passage of more gas than usual.  Walking can help get rid of the air that was put into your GI tract during the procedure and reduce the bloating. If you had a lower endoscopy (such as a colonoscopy or flexible sigmoidoscopy) you may notice spotting of blood in your stool or on the toilet paper. If you underwent a bowel prep for your procedure, you may not have a normal bowel movement for a few days.  Please Note:  You might notice some irritation and congestion in your nose or some drainage.  This is from the oxygen used during your procedure.  There is no need for concern and it should clear up in a day or so.  SYMPTOMS TO REPORT IMMEDIATELY:   Following lower endoscopy (colonoscopy or flexible sigmoidoscopy):  Excessive amounts of blood in the stool  Significant tenderness or worsening of abdominal pains  Swelling of the abdomen that is new, acute  Fever of 100F or higher  For urgent or emergent issues, a gastroenterologist can be reached at any hour by calling [phone].   DIET:  We do recommend a small meal at first, but then you may proceed to your regular diet.  Drink plenty of fluids but you should avoid alcoholic beverages for 24 hours.  ACTIVITY:  You should plan to take it easy for the rest of today and  you should NOT DRIVE or use heavy machinery until tomorrow (because of the sedation medicines used during the test).    FOLLOW UP: Our staff will call the number listed on your records the next business day following your procedure to check on you and address any questions or concerns that you may have regarding the information given to you following your procedure. If we do not reach you, we will leave a message.  However, if you are feeling well and you are not experiencing any problems, there is no need to return our call.  We will assume that you have returned to your regular daily activities without incident.  If any biopsies were taken you will be contacted by phone or by letter within the next 1-3 weeks.  Please call us at [phone] if you have not heard about the biopsies in 3 weeks.    SIGNATURES/CONFIDENTIALITY: You and/or your care partner have signed paperwork which will be entered into your electronic medical record.  These signatures attest to the fact that that the information above on your After Visit Summary has been reviewed and is understood.  Full responsibility of the confidentiality of this discharge information lies with you and/or your care-partner.

## 2017-09-11 ENCOUNTER — Telehealth: Payer: Self-pay

## 2017-09-11 NOTE — Telephone Encounter (Signed)
  Follow up Call-  Call back number 09/10/2017  Post procedure Call Back phone  # [phone]  Permission to leave phone message Yes  Some recent data might be hidden     Patient questions:  Do you have a fever, pain , or abdominal swelling? No. Pain Score  0 *  Have you tolerated food without any problems? Yes.    Have you been able to return to your normal activities? Yes.    Do you have any questions about your discharge instructions: Diet   No. Medications  No. Follow up visit  No.  Do you have questions or concerns about your Care? No.  Actions: * If pain score is 4 or above: No action needed, pain <4.

## 2017-09-13 ENCOUNTER — Encounter: Payer: Self-pay | Admitting: Internal Medicine

## 2017-09-22 ENCOUNTER — Other Ambulatory Visit: Payer: Self-pay | Admitting: Cardiology

## 2017-09-22 DIAGNOSIS — R002 Palpitations: Secondary | ICD-10-CM

## 2017-09-23 NOTE — Telephone Encounter (Signed)
Rx request sent to pharmacy.  

## 2018-03-20 ENCOUNTER — Other Ambulatory Visit: Payer: Self-pay | Admitting: Cardiology

## 2018-03-20 DIAGNOSIS — R002 Palpitations: Secondary | ICD-10-CM

## 2018-03-20 NOTE — Telephone Encounter (Signed)
Rx request sent to pharmacy.  

## 2018-09-02 ENCOUNTER — Encounter: Payer: Self-pay | Admitting: *Deleted

## 2018-09-17 ENCOUNTER — Telehealth: Payer: Self-pay | Admitting: Cardiology

## 2018-09-17 NOTE — Telephone Encounter (Signed)
smartphone/ my chart declined/ consent/ pre reg completed °

## 2018-09-18 DIAGNOSIS — R636 Underweight: Secondary | ICD-10-CM | POA: Insufficient documentation

## 2018-09-19 NOTE — Progress Notes (Signed)
Virtual Visit via Video Note   This visit type was conducted due to national recommendations for restrictions regarding the COVID-19 Pandemic (e.g. social distancing) in an effort to limit this patient's exposure and mitigate transmission in our community.  Due to her co-morbid illnesses, this patient is at least at moderate risk for complications without adequate follow up.  This format is felt to be most appropriate for this patient at this time.  All issues noted in this document were discussed and addressed.  A limited physical exam was performed with this format.  Please refer to the patient's chart for her consent to telehealth for [facility].   Date:  09/22/2018   ID:  Anita Donovan, DOB 11/01/1950, MRN [id number]  Patient Location:Home Provider Location: Home  PCP:  Bernerd Limbo, MD  Cardiologist:  Dr Stanford Breed  Evaluation Performed:  Follow-Up Visit  Chief Complaint:  FU palpitations  History of Present Illness:    FU palpitations. The patient was seen by Dr. Rollene Fare in June 2011 following an episode of palpitations requiring evaluation in the emergency room. There were apparently no rhythm strips available and he felt as though she may be having SVT. Nuclear study in July 2011 showed an ejection fraction of 72%. There was no ischemia or infarction. Echo 12/15 showed normal LV function. Abdominal ultrasound March 2016 showed no aneurysm. Since last seen,she denies dyspnea, chest pain or syncope.  She had one brief episode of palpitations not sustained.  The patient does not have symptoms concerning for COVID-19 infection (fever, chills, cough, or new shortness of breath).    Past Medical History:  Diagnosis Date  . Allergy   . Arthritis    knee right  . Multiple thyroid nodules   . Osteoporosis   . SVT (supraventricular tachycardia) (HCC)    Past Surgical History:  Procedure Laterality Date  . COLONOSCOPY    . POLYPECTOMY    . TONSILLECTOMY       Current  Meds  Medication Sig  . alendronate (FOSAMAX) 70 MG tablet Take 1 tablet by mouth once a week.  Marland Kitchen aspirin 81 MG tablet Take 81 mg by mouth daily.  . Calcium Carbonate-Vitamin D (CALTRATE 600+D PO) Take 1 capsule by mouth daily.  . Coenzyme Q10 (COQ10 PO) Take 10 mLs by mouth daily.  . metoprolol succinate (TOPROL-XL) 25 MG 24 hr tablet TAKE 1 TABLET(25 MG) BY MOUTH DAILY  . Milk Thistle 200 MG CAPS Take 1 capsule by mouth 2 (two) times daily.  . Multiple Vitamins-Minerals (CENTRUM SILVER ADULT 50+ PO) Take 1 tablet by mouth daily.  . Omega-3 Fatty Acids (FISH OIL) 1200 MG CAPS Take 1 capsule by mouth daily.   Current Facility-Administered Medications for the 09/22/18 encounter (Telemedicine) with Lelon Perla, MD  Medication  . 0.9 %  sodium chloride infusion     Allergies:   Patient has no known allergies.   Social History   Tobacco Use  . Smoking status: Former Smoker    Quit date: 04/08/2017    Years since quitting: 1.4  . Smokeless tobacco: Never Used  Substance Use Topics  . Alcohol use: Yes    Alcohol/week: 0.0 standard drinks    Comment: 2 glasses per night  . Drug use: Never     Family Hx: The patient's family history includes Heart disease in her mother. There is no history of Colon cancer, Colon polyps, Esophageal cancer, Rectal cancer, or Stomach cancer.  ROS:   Please see the history  of present illness.    No Fever, chills  or productive cough All other systems reviewed and are negative.   Wt Readings from Last 3 Encounters:  09/22/18 102 lb 4.8 oz (46.4 kg)  09/10/17 103 lb (46.7 kg)  08/20/17 103 lb 9.6 oz (47 kg)     Objective:    Vital Signs:  BP (!) 106/94 Comment: thursday @ [facility]  Ht 5' 6.5" (1.689 m)   Wt 102 lb 4.8 oz (46.4 kg)   BMI 16.26 kg/m    VITAL SIGNS:  reviewed NAD Answers questions appropriately Normal affect Remainder of physical examination not performed (telehealth visit; coronavirus pandemic)  ASSESSMENT & PLAN:    1.  Palpitations-plan to continue beta-blocker at present dose.  Her symptoms are well controlled. 2. Question history of supraventricular tachycardia-no recent symptoms.  Continue beta-blocker.  COVID-19 Education: The importance of social distancing was discussed today.  Time:   Today, I have spent 15 minutes with the patient with telehealth technology discussing the above problems.     Medication Adjustments/Labs and Tests Ordered: Current medicines are reviewed at length with the patient today.  Concerns regarding medicines are outlined above.   Tests Ordered: No orders of the defined types were placed in this encounter.   Medication Changes: No orders of the defined types were placed in this encounter.   Follow Up:  Virtual Visit or In Person in 1 year(s)  Signed, Kirk Ruths, MD  09/22/2018 9:17 AM    [place]

## 2018-09-21 ENCOUNTER — Other Ambulatory Visit: Payer: Self-pay | Admitting: Cardiology

## 2018-09-21 DIAGNOSIS — R002 Palpitations: Secondary | ICD-10-CM

## 2018-09-22 ENCOUNTER — Telehealth (INDEPENDENT_AMBULATORY_CARE_PROVIDER_SITE_OTHER): Payer: Medicare HMO | Admitting: Cardiology

## 2018-09-22 VITALS — BP 106/94 | Ht 66.5 in | Wt 102.3 lb

## 2018-09-22 DIAGNOSIS — R002 Palpitations: Secondary | ICD-10-CM | POA: Diagnosis not present

## 2018-09-22 DIAGNOSIS — I471 Supraventricular tachycardia: Secondary | ICD-10-CM

## 2018-09-22 MED ORDER — METOPROLOL SUCCINATE ER 25 MG PO TB24: ORAL_TABLET | ORAL | 3 refills | Status: DC

## 2018-09-22 NOTE — Patient Instructions (Signed)
Medication Instructions:  Refill sent to the pharmacy electronically.  If you need a refill on your cardiac medications before your next appointment, please call your pharmacy.   Lab work: If you have labs (blood work) drawn today and your tests are completely normal, you will receive your results only by: Marland Kitchen MyChart Message (if you have MyChart) OR . A paper copy in the mail If you have any lab test that is abnormal or we need to change your treatment, we will call you to review the results.  Follow-Up: At [facility], you and your health needs are our priority.  As part of our continuing mission to provide you with exceptional heart care, we have created designated Provider Care Teams.  These Care Teams include your primary Cardiologist (physician) and Advanced Practice Providers (APPs -  Physician Assistants and Nurse Practitioners) who all work together to provide you with the care you need, when you need it. You will need a follow up appointment in 12 months.  Please call our office 2 months in advance to schedule this appointment.  You may see Kirk Ruths MD or one of the following Advanced Practice Providers on your designated Care Team:   Kerin Ransom, PA-C Roby Lofts, [place] . Sande Rives, PA-C

## 2019-10-02 ENCOUNTER — Other Ambulatory Visit: Payer: Self-pay | Admitting: Cardiology

## 2019-10-02 DIAGNOSIS — R002 Palpitations: Secondary | ICD-10-CM

## 2019-10-02 MED ORDER — METOPROLOL SUCCINATE ER 25 MG PO TB24: 25.0000 mg | ORAL_TABLET | Freq: Every day | ORAL | 1 refills | Status: DC

## 2019-10-02 NOTE — Telephone Encounter (Signed)
New message   *STAT* If patient is at the pharmacy, call can be transferred to refill team.   1. Which medications need to be refilled? (please list name of each medication and dose if known) metoprolol succinate (TOPROL-XL) 25 MG 24 hr tablet  2. Which pharmacy/location (including street and city if local pharmacy) is medication to be sent to? [id number] - [place], [place] - [address] AT [address]  3. Do they need a 30 day or 90 day supply? 90 day   Patient is out of medication and needs refilled today to be able to take daily dose tonight. Please assist.

## 2019-10-31 NOTE — Progress Notes (Signed)
HPI: FU palpitations. The patient was seen by Dr. Rollene Fare in June 2011 following an episode of palpitations requiring evaluation in the emergency room. There were apparently no rhythm strips available and he felt as though she may be having SVT. Nuclear study in July 2011 showed an ejection fraction of 72%. There was no ischemia or infarction. Echo 12/15 showed normal LV function. Abdominal ultrasound March 2016 showed no aneurysm. Since last seen,she occasionally has brief flutters but no sustained palpitations.  She denies dyspnea, chest pain or syncope.  Current Outpatient Medications  Medication Sig Dispense Refill  . alendronate (FOSAMAX) 70 MG tablet Take 1 tablet by mouth once a week.  3  . aspirin 81 MG tablet Take 81 mg by mouth daily.    . Calcium Carbonate-Vitamin D (CALTRATE 600+D PO) Take 1 capsule by mouth daily.    . Coenzyme Q10 (COQ10 PO) Take 10 mLs by mouth daily.    . metoprolol succinate (TOPROL-XL) 25 MG 24 hr tablet Take 1 tablet (25 mg total) by mouth daily. 90 tablet 1  . Milk Thistle 200 MG CAPS Take 1 capsule by mouth 2 (two) times daily.    . Multiple Vitamins-Minerals (CENTRUM SILVER ADULT 50+ PO) Take 1 tablet by mouth daily.    . Omega-3 Fatty Acids (FISH OIL) 1200 MG CAPS Take 1 capsule by mouth daily.     Current Facility-Administered Medications  Medication Dose Route Frequency Provider Last Rate Last Admin  . 0.9 %  sodium chloride infusion  500 mL Intravenous Once Pyrtle, Lajuan Lines, MD         Past Medical History:  Diagnosis Date  . Allergy   . Arthritis    knee right  . Multiple thyroid nodules   . Osteoporosis   . SVT (supraventricular tachycardia) (HCC)     Past Surgical History:  Procedure Laterality Date  . COLONOSCOPY    . POLYPECTOMY    . TONSILLECTOMY      Social History   Socioeconomic History  . Marital status: Married    Spouse name: Not on file  . Number of children: Not on file  . Years of education: Not on file  .  Highest education level: Not on file  Occupational History  . Not on file  Tobacco Use  . Smoking status: Former Smoker    Quit date: 04/08/2017    Years since quitting: 2.5  . Smokeless tobacco: Never Used  Substance and Sexual Activity  . Alcohol use: Yes    Alcohol/week: 0.0 standard drinks    Comment: 2 glasses per night  . Drug use: Never  . Sexual activity: Not on file  Other Topics Concern  . Not on file  Social History Narrative  . Not on file   Social Determinants of Health   Financial Resource Strain:   . Difficulty of Paying Living Expenses:   Food Insecurity:   . Worried About Charity fundraiser in the Last Year:   . Arboriculturist in the Last Year:   Transportation Needs:   . Film/video editor (Medical):   Marland Kitchen Lack of Transportation (Non-Medical):   Physical Activity:   . Days of Exercise per Week:   . Minutes of Exercise per Session:   Stress:   . Feeling of Stress :   Social Connections:   . Frequency of Communication with Friends and Family:   . Frequency of Social Gatherings with Friends and Family:   .  Attends Religious Services:   . Active Member of Clubs or Organizations:   . Attends Archivist Meetings:   Marland Kitchen Marital Status:   Intimate Partner Violence:   . Fear of Current or Ex-Partner:   . Emotionally Abused:   Marland Kitchen Physically Abused:   . Sexually Abused:     Family History  Problem Relation Age of Onset  . Heart disease Mother        Stents  . Colon cancer Neg Hx   . Colon polyps Neg Hx   . Esophageal cancer Neg Hx   . Rectal cancer Neg Hx   . Stomach cancer Neg Hx     ROS: no fevers or chills, productive cough, hemoptysis, dysphasia, odynophagia, melena, hematochezia, dysuria, hematuria, rash, seizure activity, orthopnea, PND, pedal edema, claudication. Remaining systems are negative.  Physical Exam: Well-developed well-nourished in no acute distress.  Skin is warm and dry.  HEENT is normal.  Neck is supple.  Chest is  clear to auscultation with normal expansion.  Cardiovascular exam is regular rate and rhythm.  Abdominal exam nontender or distended. No masses palpated. Extremities show no edema. neuro grossly intact  ECG-sinus rhythm with intermittent ectopic atrial rhythm.  Personally reviewed  A/P  1 palpitations-symptoms are controlled.  Continue beta-blocker at present dose.  2 question history of SVT-patient has had no recurrent symptoms.  Continue beta-blocker.  Kirk Ruths, MD

## 2019-11-06 ENCOUNTER — Other Ambulatory Visit: Payer: Self-pay

## 2019-11-06 ENCOUNTER — Encounter: Payer: Self-pay | Admitting: Cardiology

## 2019-11-06 ENCOUNTER — Ambulatory Visit: Payer: Medicare HMO | Admitting: Cardiology

## 2019-11-06 VITALS — BP 126/76 | HR 58 | Ht 66.5 in | Wt 96.0 lb

## 2019-11-06 DIAGNOSIS — R002 Palpitations: Secondary | ICD-10-CM | POA: Diagnosis not present

## 2019-11-06 DIAGNOSIS — I471 Supraventricular tachycardia, unspecified: Secondary | ICD-10-CM

## 2019-11-06 MED ORDER — METOPROLOL SUCCINATE ER 25 MG PO TB24: 25.0000 mg | ORAL_TABLET | Freq: Every day | ORAL | 3 refills | Status: DC

## 2019-11-06 NOTE — Patient Instructions (Signed)
Medication Instructions:  NO CHANGE *If you need a refill on your cardiac medications before your next appointment, please call your pharmacy*   Lab Work: If you have labs (blood work) drawn today and your tests are completely normal, you will receive your results only by: . MyChart Message (if you have MyChart) OR . A paper copy in the mail If you have any lab test that is abnormal or we need to change your treatment, we will call you to review the results.   Follow-Up: At [facility], you and your health needs are our priority.  As part of our continuing mission to provide you with exceptional heart care, we have created designated Provider Care Teams.  These Care Teams include your primary Cardiologist (physician) and Advanced Practice Providers (APPs -  Physician Assistants and Nurse Practitioners) who all work together to provide you with the care you need, when you need it.  We recommend signing up for the patient portal called "MyChart".  Sign up information is provided on this After Visit Summary.  MyChart is used to connect with patients for Virtual Visits (Telemedicine).  Patients are able to view lab/test results, encounter notes, upcoming appointments, etc.  Non-urgent messages can be sent to your provider as well.   To learn more about what you can do with MyChart, go to [email or web address].    Your next appointment:   12 month(s)  The format for your next appointment:   In Person  Provider:   You may see BRIAN CRENSHAW MD or one of the following Advanced Practice Providers on your designated Care Team:    Luke Kilroy, PA-C  Callie Goodrich, PA-C  Jesse [place], FNP     

## 2020-04-04 ENCOUNTER — Other Ambulatory Visit: Payer: Self-pay

## 2020-04-04 ENCOUNTER — Encounter (HOSPITAL_COMMUNITY): Payer: Self-pay | Admitting: Emergency Medicine

## 2020-04-04 ENCOUNTER — Emergency Department (HOSPITAL_COMMUNITY): Payer: Medicare HMO

## 2020-04-04 ENCOUNTER — Observation Stay (HOSPITAL_COMMUNITY)
Admission: EM | Admit: 2020-04-04 | Discharge: 2020-04-05 | Disposition: A | Discharge status: Home / Self Care | Payer: Medicare HMO | Attending: Cardiovascular Disease | Admitting: Cardiovascular Disease

## 2020-04-04 DIAGNOSIS — R079 Chest pain, unspecified: Secondary | ICD-10-CM | POA: Diagnosis present

## 2020-04-04 DIAGNOSIS — I214 Non-ST elevation (NSTEMI) myocardial infarction: Secondary | ICD-10-CM | POA: Diagnosis not present

## 2020-04-04 DIAGNOSIS — I471 Supraventricular tachycardia, unspecified: Secondary | ICD-10-CM | POA: Diagnosis present

## 2020-04-04 DIAGNOSIS — Z87891 Personal history of nicotine dependence: Secondary | ICD-10-CM | POA: Diagnosis not present

## 2020-04-04 DIAGNOSIS — R002 Palpitations: Secondary | ICD-10-CM

## 2020-04-04 DIAGNOSIS — Z20822 Contact with and (suspected) exposure to covid-19: Secondary | ICD-10-CM | POA: Insufficient documentation

## 2020-04-04 DIAGNOSIS — Z8249 Family history of ischemic heart disease and other diseases of the circulatory system: Secondary | ICD-10-CM | POA: Insufficient documentation

## 2020-04-04 DIAGNOSIS — Z7982 Long term (current) use of aspirin: Secondary | ICD-10-CM | POA: Insufficient documentation

## 2020-04-04 DIAGNOSIS — J439 Emphysema, unspecified: Secondary | ICD-10-CM | POA: Insufficient documentation

## 2020-04-04 DIAGNOSIS — Z79899 Other long term (current) drug therapy: Secondary | ICD-10-CM | POA: Diagnosis not present

## 2020-04-04 LAB — BASIC METABOLIC PANEL
Anion gap: 13 (ref 5–15)
BUN: 25 mg/dL — ABNORMAL HIGH (ref 8–23)
CO2: 25 mmol/L (ref 22–32)
Calcium: 10 mg/dL (ref 8.9–10.3)
Chloride: 99 mmol/L (ref 98–111)
Creatinine, Ser: 0.63 mg/dL (ref 0.44–1.00)
GFR, Estimated: >60 mL/min (ref >60)
Glucose, Bld: 113 mg/dL — ABNORMAL HIGH (ref 70–99)
Potassium: 4.1 mmol/L (ref 3.5–5.1)
Sodium: 137 mmol/L (ref 135–145)

## 2020-04-04 LAB — TROPONIN I (HIGH SENSITIVITY)
Troponin I (High Sensitivity): 15 ng/L (ref <18)
Troponin I (High Sensitivity): 70 ng/L — ABNORMAL HIGH (ref <18)

## 2020-04-04 LAB — CBC
HCT: 39 % (ref 36.0–46.0)
Hemoglobin: 13.4 g/dL (ref 12.0–15.0)
MCH: 34.6 pg — ABNORMAL HIGH (ref 26.0–34.0)
MCHC: 34.4 g/dL (ref 30.0–36.0)
MCV: 100.8 fL — ABNORMAL HIGH (ref 80.0–100.0)
Platelets: 202 10*3/uL (ref 150–400)
RBC: 3.87 MIL/uL (ref 3.87–5.11)
RDW: 12.4 % (ref 11.5–15.5)
WBC: 5.2 10*3/uL (ref 4.0–10.5)
nRBC: 0 % (ref 0.0–0.2)

## 2020-04-04 NOTE — ED Triage Notes (Signed)
Pt brought to ED by [facility] from home for c/o mid cp that started one hour prior to arrival to ED. 3 nitros, 324 mg ASA given by EMS pta with no relief. SR on monitor, BP 136/76, HR 60, R 22, SPO2 98% RA.

## 2020-04-05 ENCOUNTER — Emergency Department (HOSPITAL_BASED_OUTPATIENT_CLINIC_OR_DEPARTMENT_OTHER): Payer: Medicare HMO

## 2020-04-05 ENCOUNTER — Other Ambulatory Visit: Payer: Self-pay

## 2020-04-05 ENCOUNTER — Encounter (HOSPITAL_COMMUNITY)
Admission: EM | Disposition: A | Discharge status: Home / Self Care | Payer: Self-pay | Source: Home / Self Care | Attending: Emergency Medicine

## 2020-04-05 DIAGNOSIS — I214 Non-ST elevation (NSTEMI) myocardial infarction: Principal | ICD-10-CM | POA: Diagnosis present

## 2020-04-05 HISTORY — PX: LEFT HEART CATH AND CORONARY ANGIOGRAPHY: CATH118249

## 2020-04-05 LAB — BASIC METABOLIC PANEL
Anion gap: 13 (ref 5–15)
BUN: 17 mg/dL (ref 8–23)
CO2: 23 mmol/L (ref 22–32)
Calcium: 9 mg/dL (ref 8.9–10.3)
Chloride: 102 mmol/L (ref 98–111)
Creatinine, Ser: 0.6 mg/dL (ref 0.44–1.00)
GFR, Estimated: >60 mL/min (ref >60)
Glucose, Bld: 87 mg/dL (ref 70–99)
Potassium: 4 mmol/L (ref 3.5–5.1)
Sodium: 138 mmol/L (ref 135–145)

## 2020-04-05 LAB — HEMOGLOBIN A1C
Hgb A1c MFr Bld: 5.1 % (ref 4.8–5.6)
Mean Plasma Glucose: 99.67 mg/dL

## 2020-04-05 LAB — LIPID PANEL
Cholesterol: 161 mg/dL (ref 0–200)
HDL: 66 mg/dL (ref >40)
LDL Cholesterol: 85 mg/dL (ref 0–99)
Total CHOL/HDL Ratio: 2.4 RATIO
Triglycerides: 51 mg/dL (ref <150)
VLDL: 10 mg/dL (ref 0–40)

## 2020-04-05 LAB — ECHOCARDIOGRAM COMPLETE
AR max vel: 2.22 cm2
AV Area VTI: 2.11 cm2
AV Area mean vel: 2.08 cm2
AV Mean grad: 2 mmHg
AV Peak grad: 3.1 mmHg
Ao pk vel: 0.89 m/s
Area-P 1/2: 4.06 cm2
Height: 67 in
S' Lateral: 2.5 cm
Weight: 1520 oz

## 2020-04-05 LAB — RESP PANEL BY RT-PCR (FLU A&B, COVID) ARPGX2
Influenza A by PCR: NEGATIVE
Influenza B by PCR: NEGATIVE
SARS Coronavirus 2 by RT PCR: NEGATIVE

## 2020-04-05 LAB — TROPONIN I (HIGH SENSITIVITY)
Troponin I (High Sensitivity): 973 ng/L (ref <18)
Troponin I (High Sensitivity): 826 ng/L (ref <18)

## 2020-04-05 SURGERY — LEFT HEART CATH AND CORONARY ANGIOGRAPHY
Anesthesia: LOCAL

## 2020-04-05 MED ORDER — ASPIRIN 81 MG PO CHEW
324.0000 mg | CHEWABLE_TABLET | Freq: Once | ORAL | Status: AC
  Administered 2020-04-05: 324 mg via ORAL
  Filled 2020-04-05: qty 4

## 2020-04-05 MED ORDER — NITROGLYCERIN 0.4 MG SL SUBL: 0.4000 mg | SUBLINGUAL_TABLET | SUBLINGUAL | 3 refills | Status: AC | PRN

## 2020-04-05 MED ORDER — ACETAMINOPHEN 325 MG PO TABS: 650.0000 mg | ORAL_TABLET | ORAL | Status: DC | PRN

## 2020-04-05 MED ORDER — LIDOCAINE HCL (PF) 1 % IJ SOLN
INTRAMUSCULAR | Status: DC | PRN
  Administered 2020-04-05: 2 mL via INTRADERMAL

## 2020-04-05 MED ORDER — IOHEXOL 350 MG/ML SOLN
INTRAVENOUS | Status: DC | PRN
  Administered 2020-04-05: 50 mL via INTRA_ARTICULAR

## 2020-04-05 MED ORDER — SODIUM CHLORIDE 0.9 % IV SOLN: 250.0000 mL | INTRAVENOUS | Status: DC | PRN

## 2020-04-05 MED ORDER — SODIUM CHLORIDE 0.9 % WEIGHT BASED INFUSION: 3.0000 mL/kg/h | INTRAVENOUS | Status: AC

## 2020-04-05 MED ORDER — ASPIRIN 81 MG PO CHEW: 81.0000 mg | CHEWABLE_TABLET | Freq: Every day | ORAL | Status: DC

## 2020-04-05 MED ORDER — VERAPAMIL HCL 2.5 MG/ML IV SOLN
INTRAVENOUS | Status: AC
  Filled 2020-04-05: qty 2

## 2020-04-05 MED ORDER — MIDAZOLAM HCL 2 MG/2ML IJ SOLN
INTRAMUSCULAR | Status: DC | PRN
  Administered 2020-04-05: 1 mg via INTRAVENOUS

## 2020-04-05 MED ORDER — METOPROLOL SUCCINATE ER 25 MG PO TB24: 25.0000 mg | ORAL_TABLET | ORAL | 3 refills | Status: DC | PRN

## 2020-04-05 MED ORDER — HEPARIN SODIUM (PORCINE) 1000 UNIT/ML IJ SOLN
INTRAMUSCULAR | Status: DC | PRN
  Administered 2020-04-05: 2500 [IU] via INTRAVENOUS

## 2020-04-05 MED ORDER — LABETALOL HCL 5 MG/ML IV SOLN
10.0000 mg | INTRAVENOUS | Status: DC | PRN
Start: 2020-04-05 — End: 2020-04-05

## 2020-04-05 MED ORDER — SODIUM CHLORIDE 0.9% FLUSH: 3.0000 mL | INTRAVENOUS | Status: DC | PRN

## 2020-04-05 MED ORDER — HEPARIN SODIUM (PORCINE) 1000 UNIT/ML IJ SOLN
INTRAMUSCULAR | Status: AC
  Filled 2020-04-05: qty 1

## 2020-04-05 MED ORDER — SODIUM CHLORIDE 0.9 % WEIGHT BASED INFUSION: 1.0000 mL/kg/h | INTRAVENOUS | Status: DC

## 2020-04-05 MED ORDER — SODIUM CHLORIDE 0.9% FLUSH: 3.0000 mL | Freq: Two times a day (BID) | INTRAVENOUS | Status: DC

## 2020-04-05 MED ORDER — HYDRALAZINE HCL 20 MG/ML IJ SOLN: 10.0000 mg | INTRAMUSCULAR | Status: DC | PRN

## 2020-04-05 MED ORDER — LIDOCAINE HCL (PF) 1 % IJ SOLN
INTRAMUSCULAR | Status: AC
  Filled 2020-04-05: qty 60

## 2020-04-05 MED ORDER — CARVEDILOL 6.25 MG PO TABS: 6.2500 mg | ORAL_TABLET | Freq: Two times a day (BID) | ORAL | 6 refills | Status: DC

## 2020-04-05 MED ORDER — HEPARIN (PORCINE) IN NACL 1000-0.9 UT/500ML-% IV SOLN
INTRAVENOUS | Status: AC
  Filled 2020-04-05: qty 1000

## 2020-04-05 MED ORDER — VERAPAMIL HCL 2.5 MG/ML IV SOLN
INTRAVENOUS | Status: DC | PRN
  Administered 2020-04-05: 10 mL via INTRA_ARTERIAL

## 2020-04-05 MED ORDER — ONDANSETRON HCL 4 MG/2ML IJ SOLN: 4.0000 mg | Freq: Four times a day (QID) | INTRAMUSCULAR | Status: DC | PRN

## 2020-04-05 MED ORDER — SODIUM CHLORIDE 0.9 % IV SOLN: INTRAVENOUS | Status: DC

## 2020-04-05 MED ORDER — FENTANYL CITRATE (PF) 100 MCG/2ML IJ SOLN
INTRAMUSCULAR | Status: AC
  Filled 2020-04-05: qty 2

## 2020-04-05 MED ORDER — HEPARIN (PORCINE) IN NACL 1000-0.9 UT/500ML-% IV SOLN
INTRAVENOUS | Status: DC | PRN
  Administered 2020-04-05 (×2): 500 mL

## 2020-04-05 MED ORDER — FENTANYL CITRATE (PF) 100 MCG/2ML IJ SOLN
INTRAMUSCULAR | Status: DC | PRN
  Administered 2020-04-05: 25 ug via INTRAVENOUS

## 2020-04-05 MED ORDER — MIDAZOLAM HCL 2 MG/2ML IJ SOLN
INTRAMUSCULAR | Status: AC
  Filled 2020-04-05: qty 2

## 2020-04-05 SURGICAL SUPPLY — 9 items
CATH 5FR JL3.5 JR4 ANG PIG MP (CATHETERS) ×2 IMPLANT
DEVICE RAD TR BAND REGULAR (VASCULAR PRODUCTS) ×2 IMPLANT
GLIDESHEATH SLEND A-KIT 6F 22G (SHEATH) ×2 IMPLANT
GUIDEWIRE INQWIRE 1.5J.035X260 (WIRE) ×1 IMPLANT
INQWIRE 1.5J .035X260CM (WIRE) ×2
KIT HEART LEFT (KITS) ×2 IMPLANT
PACK CARDIAC CATHETERIZATION (CUSTOM PROCEDURE TRAY) ×2 IMPLANT
TRANSDUCER W/STOPCOCK (MISCELLANEOUS) ×2 IMPLANT
TUBING CIL FLEX 10 FLL-RA (TUBING) ×2 IMPLANT

## 2020-04-05 NOTE — CV Procedure (Signed)
   Widely patent coronary arteries.  Right dominant.  Each territory contains vessel tortuosity but no significant obstruction or evidence of atherosclerosis.  Normal LV function.  In the context of the clinical presentation: MINOCA cannot be excluded.  It is also possible that other causes of myocardial injury such as myocarditis, coronary spasm, or other less common entities may exist.  I see no evidence of spontaneous coronary dissection and in absence of wall motion abnormality, stress cardiomyopathy/variant is unlikely.Marland Kitchen

## 2020-04-05 NOTE — Interval H&P Note (Signed)
Cath Lab Visit (complete for each Cath Lab visit)  Clinical Evaluation Leading to the Procedure:   ACS: Yes.    Non-ACS:    Anginal Classification: CCS III  Anti-ischemic medical therapy: Minimal Therapy (1 class of medications)  Non-Invasive Test Results: No non-invasive testing performed  Prior CABG: No previous CABG      History and Physical Interval Note:  04/05/2020 4:15 PM  Anita Donovan  has presented today for surgery, with the diagnosis of N Stemi.  The various methods of treatment have been discussed with the patient and family. After consideration of risks, benefits and other options for treatment, the patient has consented to  Procedure(s): LEFT HEART CATH AND CORONARY ANGIOGRAPHY (N/A) as a surgical intervention.  The patient's history has been reviewed, patient examined, no change in status, stable for surgery.  I have reviewed the patient's chart and labs.  Questions were answered to the patient's satisfaction.     Lyn Records III

## 2020-04-05 NOTE — Progress Notes (Signed)
  Echocardiogram 2D Echocardiogram has been performed.  Anita Donovan 04/05/2020, 2:14 PM

## 2020-04-05 NOTE — H&P (Addendum)
Cardiology Admission History and Physical:   Patient ID: [name] MRN: [id number]; DOB: 01-18-1951   Admission date: 04/04/2020  Primary Care Provider: Bernerd Limbo, MD [facility] Cardiologist: Kirk Ruths, MD  [facility] Electrophysiologist:  None   Chief Complaint:  Chest pain  Patient Profile:   Anita Donovan is a 70 y.o. female with a PMH of SVT and tobacco abuse, who presented to the ED with chest pain, found to have an NSTEMI.   History of Present Illness:   Anita Donovan was in her usual state of health until yesterday evening when she developed substernal chest pain radiating to her left arm. She was sitting down at the time of onset. No significant worsening of symptoms with activity. She denies any associated SOB, nausea, vomiting, dizziness, lightheadedness, syncope, or palpitations at the time of onset. She attempted to lay down, however symptoms persisted prompting EMS activation. She was given 3 SL nitro en route to the ED with some relief but not resolution of her chest pain.  She was last evaluated by cardiology at an outpatient visit with Dr. Stanford Breed 11/2019 for routine follow-up of her SVT/palpitations. Symptoms were fairly well controlled with only occasional brief flutters at that time. Her last echocardiogram in 2015 showed EF 60-65%, normal LV diastolic function, no RWMA, and no significant valvular abnormalities. She had a remote stress test in 2011 which was without ischemia or infarction.   At the time of this evaluation she is chest pain free. Her symptoms resolved sometime overnight. She has not had trouble with chest pain in the past. She is fairly active at baseline though does not get formal exercise. She denies recent SOB, DOE, LE edema, orthopnea, or PND. She has a history of palpitations though no significant change recently - she did have an episode of palpitations 04/03/20 for which she took her prn metoprolol with resolution of symptoms. She  gets foot cramps but denies claudication. She has a history of tobacco abuse though has weaned herself down to 1-2 cigarettes a day for the past couple years. She has a family history of CAD in her mother who had cardiac stents placed but she does not recall how old she was at that time.  ED course: Intermittently hypertensive, bradycardic tot he 40s-50s, otherwise VSS. Labs notable for electrolytes wnl, Cr 0.63, Hgb 13.4, PLT 202, HsTrop 15>70>973. COVID/influenza negative. CXR c/w emphysema without acute findings. EKG with sinus rhythm rate 55bpm, minimal STE in V2 and submmSTE in V3, no TWI. She was given 324mg  aspirin and SL nitro x3 by EMS prior to arrival. Cardiology asked to evaluate patient.    Past Medical History:  Diagnosis Date   Allergy    Arthritis    knee right   Multiple thyroid nodules    Osteoporosis    SVT (supraventricular tachycardia) (HCC)     Past Surgical History:  Procedure Laterality Date   COLONOSCOPY     POLYPECTOMY     TONSILLECTOMY       Medications Prior to Admission: Prior to Admission medications   Medication Sig Start Date End Date Taking? Authorizing Provider  alendronate (FOSAMAX) 70 MG tablet Take 1 tablet by mouth once a week. 04/20/16   [provider]  aspirin 81 MG tablet Take 81 mg by mouth daily.    [provider]  Calcium Carbonate-Vitamin D (CALTRATE 600+D PO) Take 1 capsule by mouth daily.    [provider]  Coenzyme Q10 (COQ10 PO) Take 10 mLs  by mouth daily.    [provider]  metoprolol succinate (TOPROL-XL) 25 MG 24 hr tablet Take 1 tablet (25 mg total) by mouth daily. 11/06/19   Lewayne Bunting, MD  Milk Thistle 200 MG CAPS Take 1 capsule by mouth 2 (two) times daily.    [provider]  Multiple Vitamins-Minerals (CENTRUM SILVER ADULT 50+ PO) Take 1 tablet by mouth daily.    [provider]  Omega-3 Fatty Acids (FISH OIL) 1200 MG CAPS Take 1 capsule by mouth daily.    [provider]     Allergies:   No Known Allergies  Social History:   Social History   Socioeconomic History   Marital status: Married    Spouse name: Not on file   Number of children: Not on file   Years of education: Not on file   Highest education level: Not on file  Occupational History   Not on file  Tobacco Use   Smoking status: Former Smoker    Quit date: 04/08/2017    Years since quitting: 2.9   Smokeless tobacco: Never Used  Substance and Sexual Activity   Alcohol use: Yes    Alcohol/week: 0.0 standard drinks    Comment: 2 glasses per night   Drug use: Never   Sexual activity: Not on file  Other Topics Concern   Not on file  Social History Narrative   Not on file   Social Determinants of Health   Financial Resource Strain: Not on file  Food Insecurity: Not on file  Transportation Needs: Not on file  Physical Activity: Not on file  Stress: Not on file  Social Connections: Not on file  Intimate Partner Violence: Not on file    Family History:   The patient's family history includes Heart disease in her mother. There is no history of Colon cancer, Colon polyps, Esophageal cancer, Rectal cancer, or Stomach cancer.    ROS:  Please see the history of present illness.  All other ROS reviewed and negative.     Physical Exam/Data:   Vitals:   04/05/20 1245 04/05/20 1300 04/05/20 1330 04/05/20 1415  BP: (!) 171/141 (!) 162/67 114/73 122/62  Pulse: (!) 59 (!) 53 (!) 56 (!) 48  Resp: 16 16 18 15   Temp:      TempSrc:      SpO2: 98% 100% 99% 97%  Weight:      Height:       No intake or output data in the 24 hours ending 04/05/20 1431 Last 3 Weights 04/05/2020 11/06/2019 09/22/2018  Weight (lbs) 95 lb 96 lb 102 lb 4.8 oz  Weight (kg) 43.092 kg 43.545 kg 46.403 kg     Body mass index is 14.88 kg/m.  General:  Well nourished, well developed, in no acute distress HEENT: sclera anicteric Neck: no JVD Vascular: No carotid bruits; distal pulses 2+  bilaterally Cardiac:  normal S1, S2; RRR; no murmurs, rubs, or gallops Lungs:  clear to auscultation bilaterally, no wheezing, rhonchi or rales  Abd: soft, nontender, no hepatomegaly  Ext: no edema Musculoskeletal:  No deformities, BUE and BLE strength normal and equal Skin: warm and dry  Neuro:  CNs 2-12 intact, no focal abnormalities noted Psych:  Normal affect    EKG:  sinus rhythm rate 55bpm, minimal STE in V2 and submmSTE in V3, no TWI.  Relevant CV Studies: Echocardiogram 2015: - Left ventricle: The cavity size was normal. Wall thickness was    normal. Systolic function  was normal. The estimated ejection    fraction was in the range of 60% to 65%. Wall motion was normal;    there were no regional wall motion abnormalities. Left    ventricular diastolic function parameters were normal.  - Aortic valve: There was no stenosis.  - Mitral valve: There was no regurgitation.  - Right ventricle: The cavity size was normal. Systolic function    was normal.  - Tricuspid valve: Peak RV-RA gradient (S): 23 mm Hg.  - Pulmonary arteries: PA peak pressure: 26 mm Hg (S).  - Inferior vena cava: The vessel was normal in size. The    respirophasic diameter changes were in the normal range (= 50%),    consistent with normal central venous pressure.    Laboratory Data:  High Sensitivity Troponin:   Recent Labs  Lab 04/04/20 2023 04/04/20 2233 04/05/20 0827 04/05/20 1208  TROPONINIHS 15 70* 973* 826*      Chemistry Recent Labs  Lab 04/04/20 2023 04/05/20 1208  NA 137 138  K 4.1 4.0  CL 99 102  CO2 25 23  GLUCOSE 113* 87  BUN 25* 17  CREATININE 0.63 0.60  CALCIUM 10.0 9.0  GFRNONAA >60 >60  ANIONGAP 13 13    No results for input(s): PROT, ALBUMIN, AST, ALT, ALKPHOS, BILITOT in the last 168 hours. Hematology Recent Labs  Lab 04/04/20 2023  WBC 5.2  RBC 3.87  HGB 13.4  HCT 39.0  MCV 100.8*  MCH 34.6*  MCHC 34.4  RDW 12.4  PLT 202   BNPNo results for input(s):  BNP, PROBNP in the last 168 hours.  DDimer No results for input(s): DDIMER in the last 168 hours.   Radiology/Studies:  DG Chest 2 View  Result Date: 04/04/2020 CLINICAL DATA:  Chest pain for 1 day, SVT, previous tobacco abuse EXAM: CHEST - 2 VIEW COMPARISON:  09/10/2009 FINDINGS: Frontal and lateral views of the chest demonstrate an unremarkable cardiac silhouette. Lungs are hyperinflated with background interstitial prominence consistent with emphysema. No airspace disease, effusion, or pneumothorax. There are no acute bony abnormalities. IMPRESSION: 1. Emphysema, no acute airspace disease. Electronically Signed   By: Randa Ngo M.D.   On: 04/04/2020 20:34     Assessment and Plan:   1. NSTEMI: patient presented with chest pain. EKG with submm STE in V2-3. HsTrop uptrending: 15>70>973. No recent ischemic testing, though NST in 2011 was negative for ischemia/infarction. Echo in 2015 with EF 60-65%, normal LV diastolic function, no RWMA, and no significant valvular abnormalities. Risk factors for CAD include HTN and tobacco abuse. LDL 85, HgbA1C 5.1.  - Will check an echocardiogram  - Will plan for LHC to further evaluate symptoms at trop elevation. The patient understands that risks included but are not limited to stroke (1 in 1000), death (1 in 49), kidney failure [usually temporary] (1 in 500), bleeding (1 in 200), allergic reaction [possibly serious] (1 in 200).  The patient understands and agrees to proceed.  - Will start atorvastatin 80mg  daily - Continue aspirin 81mg  daily - Continue metoprolol succinate  2. Elevated BP without diagnosis of HTN: BP intermittently elevated. She has not had her metoprolol today - Will continue home metoprolol succinate - Further recommendations pending above work-up  3. SVT: no episodes on telemetry. Baseline rhythm is sinus bradycardia. No symptoms to suggest symptomatic bradycardia - Continue metoprolol succinate  4. Tobacco abuse: still smoking  1-2 cigarettes a day. We discussed the importance of quitting.  - Continue to encourage smoking  cessation.       TIMI Risk Score for Unstable Angina or Non-ST Elevation MI:   The patient's TIMI risk score is 3, which indicates a 13% risk of all cause mortality, new or recurrent myocardial infarction or need for urgent revascularization in the next 14 days.      Severity of Illness: The appropriate patient status for this patient is OBSERVATION. Observation status is judged to be reasonable and necessary in order to provide the required intensity of service to ensure the patient's safety. The patient's presenting symptoms, physical exam findings, and initial radiographic and laboratory data in the context of their medical condition is felt to place them at decreased risk for further clinical deterioration. Furthermore, it is anticipated that the patient will be medically stable for discharge from the hospital within 2 midnights of admission. The following factors support the patient status of observation.   " The patient's presenting symptoms include chest pain. " The physical exam findings include benign cardiopulmonary exam. " The initial radiographic and laboratory data are elevated HsTroponin.     For questions or updates, please contact [place] Please consult [email or web address] for contact info under     Signed, Abigail Butts, PA-C  04/05/2020 2:31 PM   I have seen and examined the patient along with Abigail Butts, PA-C .  I have reviewed the chart, notes and new data.  I agree with PA/NP's note.  Key new complaints: symptoms are strongly suggestive of unstable angina (onset at rest, temporary relief w SL NTG, then recurred) Key examination changes: normal CV exam, very slender Key new findings / data: mild sinus bradycardia, no major ischemic abnormalities, marked increase in hs Trop I (but still indicating relatively minor myocardial injury).  PLAN: Small NSTEMI,  cannot assess location due to absence of significant ECG changes. Recommend cardiac cath and if appropriate PCI-DES. This procedure has been fully reviewed with the patient and written informed consent has been obtained. Avoid increased dose of beta blockers due to bradycardia. Permanent smoking cessation. If CAD is confirmed, high dose atorvastatin x 1 year, then long term statin to achieve LDL <70.   Sanda Klein, MD, [place] [phone] 04/05/2020, 3:44 PM

## 2020-04-05 NOTE — ED Provider Notes (Signed)
[facility] EMERGENCY DEPARTMENT Provider Note   CSN: [id number] Arrival date & time: 04/04/20  2007     History Chief Complaint  Patient presents with  . Chest Pain    Anita Donovan is a 70 y.o. female.  HPI     Past Medical History:  Diagnosis Date  . Allergy   . Arthritis    knee right  . Multiple thyroid nodules   . Osteoporosis   . SVT (supraventricular tachycardia) [facility])     Patient Active Problem List   Diagnosis Date Noted  . Bruit 05/21/2014  . Palpitations 02/12/2014  . SVT (supraventricular tachycardia) ([place]) 02/12/2014  . Tobacco abuse 02/12/2014    Past Surgical History:  Procedure Laterality Date  . COLONOSCOPY    . POLYPECTOMY    . TONSILLECTOMY       OB History   No obstetric history on file.     Family History  Problem Relation Age of Onset  . Heart disease Mother        Stents  . Colon cancer Neg Hx   . Colon polyps Neg Hx   . Esophageal cancer Neg Hx   . Rectal cancer Neg Hx   . Stomach cancer Neg Hx     Social History   Tobacco Use  . Smoking status: Former Smoker    Quit date: 04/08/2017    Years since quitting: 2.9  . Smokeless tobacco: Never Used  Substance Use Topics  . Alcohol use: Yes    Alcohol/week: 0.0 standard drinks    Comment: 2 glasses per night  . Drug use: Never    Home Medications Prior to Admission medications   Medication Sig Start Date End Date Taking? Authorizing Provider  alendronate (FOSAMAX) 70 MG tablet Take 1 tablet by mouth once a week. 04/20/16   [provider]  aspirin 81 MG tablet Take 81 mg by mouth daily.    [provider]  Calcium Carbonate-Vitamin D (CALTRATE 600+D PO) Take 1 capsule by mouth daily.    [provider]  Coenzyme Q10 (COQ10 PO) Take 10 mLs by mouth daily.    [provider]  metoprolol succinate (TOPROL-XL) 25 MG 24 hr tablet Take 1 tablet (25 mg total) by mouth daily. 11/06/19   Lelon Perla, MD  Milk Thistle  200 MG CAPS Take 1 capsule by mouth 2 (two) times daily.    [provider]  Multiple Vitamins-Minerals (CENTRUM SILVER ADULT 50+ PO) Take 1 tablet by mouth daily.    [provider]  Omega-3 Fatty Acids (FISH OIL) 1200 MG CAPS Take 1 capsule by mouth daily.    [provider]    Allergies    Patient has no known allergies.  Review of Systems   Review of Systems  Physical Exam Updated Vital Signs BP (!) 152/58   Pulse (!) 49   Temp 98.7 F (37.1 C) (Oral)   Resp 17   Ht 5\' 7"  (1.702 m)   Wt 43.1 kg   SpO2 98%   BMI 14.88 kg/m   Physical Exam  ED Results / Procedures / Treatments   Labs (all labs ordered are listed, but only abnormal results are displayed) Labs Reviewed  BASIC METABOLIC PANEL - Abnormal; Notable for the following components:      Result Value   Glucose, Bld 113 (*)    BUN 25 (*)    All other components within normal limits  CBC - Abnormal; Notable for  the following components:   MCV 100.8 (*)    MCH 34.6 (*)    All other components within normal limits  TROPONIN I (HIGH SENSITIVITY) - Abnormal; Notable for the following components:   Troponin I (High Sensitivity) 70 (*)    All other components within normal limits  TROPONIN I (HIGH SENSITIVITY) - Abnormal; Notable for the following components:   Troponin I (High Sensitivity) 973 (*)    All other components within normal limits  RESP PANEL BY RT-PCR (FLU A&B, COVID) [id number]  TROPONIN I (HIGH SENSITIVITY)  TROPONIN I (HIGH SENSITIVITY)    EKG EKG Interpretation  Date/Time:  Tuesday April 05 2020 08:19:50 EST Ventricular Rate:  55 PR Interval:    QRS Duration: 83 QT Interval:  457 QTC Calculation: 438 R Axis:   75 Text Interpretation: Sinus rhythm Anterior infarct, old since last tracing no significant change Confirmed by Daleen Bo [phone]) on 04/05/2020 8:45:18 AM     Radiology DG Chest 2 View  Result Date: 04/04/2020 CLINICAL DATA:  Chest pain for 1 day,  SVT, previous tobacco abuse EXAM: CHEST - 2 VIEW COMPARISON:  09/10/2009 FINDINGS: Frontal and lateral views of the chest demonstrate an unremarkable cardiac silhouette. Lungs are hyperinflated with background interstitial prominence consistent with emphysema. No airspace disease, effusion, or pneumothorax. There are no acute bony abnormalities. IMPRESSION: 1. Emphysema, no acute airspace disease. Electronically Signed   By: Randa Ngo M.D.   On: 04/04/2020 20:34    Procedures .Critical Care Performed by: Daleen Bo, MD Authorized by: Daleen Bo, MD   Critical care provider statement:    Critical care time (minutes):  35   Critical care start time:  04/05/2020 8:05 AM   Critical care end time:  04/05/2020 10:51 AM   Critical care time was exclusive of:  Separately billable procedures and treating other patients   Critical care was necessary to treat or prevent imminent or life-threatening deterioration of the following conditions:  Cardiac failure   Critical care was time spent personally by me on the following activities:  Blood draw for specimens, development of treatment plan with patient or surrogate, discussions with consultants, evaluation of patient's response to treatment, examination of patient, obtaining history from patient or surrogate, ordering and performing treatments and interventions, ordering and review of laboratory studies, pulse oximetry, re-evaluation of patient's condition, review of old charts and ordering and review of radiographic studies   (including critical care time)  Medications Ordered in ED Medications  aspirin chewable tablet 324 mg (324 mg Oral Given 04/05/20 0827)    ED Course  I have reviewed the triage vital signs and the nursing notes.  Pertinent labs & imaging results that were available during my care of the patient were reviewed by me and considered in my medical decision making (see chart for details).  Clinical Course as of 04/05/20 1051   Tue Apr 05, 2020  1045 As discussed with cardiology Dr. Sallyanne Kuster, who will see the patient in the ED for recommendations and disposition. [EW]    Clinical Course User Index [EW] Daleen Bo, MD   MDM Rules/Calculators/A&P                           Patient Vitals for the past 24 hrs:  BP Temp Temp src Pulse Resp SpO2 Height Weight  04/05/20 1030 (!) 152/58 - - (!) 49 17 98 % - -  04/05/20 1000 140/62 - - (!) 51 18 97 % - -  04/05/20 0930 (!) 145/60 - - (!) 51 17 98 % - -  04/05/20 0900 (!) 147/61 - - (!) 51 17 100 % - -  04/05/20 0822 - - - - - - 5\' 7"  (1.702 m) 43.1 kg  04/05/20 0821 (!) 159/73 98.7 F (37.1 C) Oral (!) 56 16 97 % - -  04/05/20 0548 134/66 98.1 F (36.7 C) - (!) 57 16 99 % - -  04/05/20 0341 119/65 98.3 F (36.8 C) Oral 60 16 97 % - -  04/05/20 0140 126/62 - - 64 16 96 % - -  04/04/20 2333 (!) 119/58 - - 60 17 94 % - -  04/04/20 2207 130/65 - - 84 18 98 % - -  04/04/20 2017 (!) 132/58 98.8 F (37.1 C) Oral 64 16 98 % - -    10:46 AM Reevaluation with update and discussion. After initial assessment and treatment, an updated evaluation reveals she remains comfortable.  No recurrence of chest pain.  Findings discussed with the patient and all questions were answered. 2018   Medical Decision Making:  This patient is presenting for evaluation of chest pain, which does require a range of treatment options, and is a complaint that involves a moderate risk of morbidity and mortality. The differential diagnoses include ACS,, chest wall pain. I decided to review old records, and in summary elderly female presenting with an episode of chest pain, which spontaneously resolved last night.  I do not require additional historical information from anyone.  Clinical Laboratory Tests Ordered, included CBC, Metabolic panel and Delta troponin, repeat troponin. Review indicates initial delta troponin positive, repeat troponin after prolonged wait in the emergency  department, more elevated; remainder labs are reassuring.  Screening viral panel negative.. Radiologic Tests Ordered, included chest x-ray.  I independently Visualized: Radiographic images, which show no infiltrate or edema  Cardiac Monitor Tracing which shows normal sinus rhythm    Critical Interventions-clinical evaluation, laboratory testing, chest x-ray, repeat laboratory testing, observation reassessment  After These Interventions, the Patient was reevaluated and was found with NSTEMI likely isolated event, yesterday evening, with gradually increasing troponin since then.  No recurrence of chest pain.  Cardiology consultation, they will see the patient in the ED for final disposition.  CRITICAL CARE-yes Performed by: Mancel Bale  Nursing Notes Reviewed/ Care Coordinated Applicable Imaging Reviewed Interpretation of Laboratory Data incorporated into ED treatment  Plan-disposition by cardiology   Final Clinical Impression(s) / ED Diagnoses Final diagnoses:  NSTEMI (non-ST elevated myocardial infarction) [facility])    Rx / DC Orders ED Discharge Orders    None       [facility], MD 04/05/20 1052

## 2020-04-05 NOTE — Discharge Instructions (Signed)
PLEASE REMEMBER TO BRING ALL OF YOUR MEDICATIONS TO EACH OF YOUR FOLLOW-UP OFFICE VISITS.  PLEASE ATTEND ALL SCHEDULED FOLLOW-UP APPOINTMENTS.   Activity: Increase activity slowly as tolerated. You may shower, but no soaking baths (or swimming) for 1 week. No driving for 24 hours. No lifting over 5 lbs for 1 week. No sexual activity for 1 week.   You May Return to Work: in 1 week (if applicable)  Wound Care: You may wash cath site gently with soap and water. Keep cath site clean and dry. If you notice pain, swelling, bleeding or pus at your cath site, please call [phone].   Medication changes: - STOP daily metoprolol succinate - this medication can continue to be used on an as needed basis for your palpitations though is being replaced with a different medication for daily use - START carvedilol 6.25mg  twice daily for possible coronary artery spasms, high blood pressure, and palpitations - A prescription for sublingual (under the tongue) nitroglycerine was sent in the event you have more chest pain. Please follow the instructions on the bottle. If you take a 3rd tablet for an episode of chest pain you should call EMS.

## 2020-04-05 NOTE — Discharge Summary (Addendum)
Discharge Summary    Patient ID: BIANCE DIERKER MRN: [id number]; DOB: 08-Apr-1950  Admit date: 04/04/2020 Discharge date: 04/05/2020  Primary Care Provider: Bernerd Limbo, MD  Primary Cardiologist: Kirk Ruths, MD  Primary Electrophysiologist:  None   Discharge Diagnoses    Principal Problem:   NSTEMI (non-ST elevated myocardial infarction) [facility]) Active Problems:   Palpitations   SVT (supraventricular tachycardia) ([place])    Diagnostic Studies/Procedures    Echocardiogram 04/06/20: 1. Left ventricular ejection fraction, by estimation, is 60 to 65%. The  left ventricle has normal function. The left ventricle has no regional  wall motion abnormalities. Left ventricular diastolic parameters are  indeterminate.   2. Right ventricular systolic function is normal. The right ventricular  size is normal. There is normal pulmonary artery systolic pressure. The  estimated right ventricular systolic pressure is 123XX123 mmHg.   3. The mitral valve is grossly normal. Trivial mitral valve  regurgitation. No evidence of mitral stenosis.   4. The aortic valve is tricuspid. Aortic valve regurgitation is not  visualized. No aortic stenosis is present.   5. The inferior vena cava is normal in size with greater than 50%  respiratory variability, suggesting right atrial pressure of 3 mmHg.  Left heart catheterization 04/06/20:  _____________   History of Present Illness     FAIZA KURZAWA is a 70 y.o. female with PMH of SVT and tobacco abuse, who presented to the ED with chest pain, found to have an NSTEMI.  Ms. Villalva was in her usual state of health until yesterday evening when she developed substernal chest pain radiating to her left arm. She was sitting down at the time of onset. No significant worsening of symptoms with activity. She denies any associated SOB, nausea, vomiting, dizziness, lightheadedness, syncope, or palpitations at the time of onset. She attempted to lay down, however  symptoms persisted prompting EMS activation. She was given 3 SL nitro en route to the ED with some relief but not resolution of her chest pain.   She was last evaluated by cardiology at an outpatient visit with Dr. Stanford Breed 11/2019 for routine follow-up of her SVT/palpitations. Symptoms were fairly well controlled with only occasional brief flutters at that time. Her last echocardiogram in 2015 showed EF 60-65%, normal LV diastolic function, no RWMA, and no significant valvular abnormalities. She had a remote stress test in 2011 which was without ischemia or infarction.    At the time of this evaluation she is chest pain free. Her symptoms resolved sometime overnight. She has not had trouble with chest pain in the past. She is fairly active at baseline though does not get formal exercise. She denies recent SOB, DOE, LE edema, orthopnea, or PND. She has a history of palpitations though no significant change recently - she did have an episode of palpitations 04/03/20 for which she took her prn metoprolol with resolution of symptoms. She gets foot cramps but denies claudication. She has a history of tobacco abuse though has weaned herself down to 1-2 cigarettes a day for the past couple years. She has a family history of CAD in her mother who had cardiac stents placed but she does not recall how old she was at that time.   ED course: Intermittently hypertensive, bradycardic tot he 40s-50s, otherwise VSS. Labs notable for electrolytes wnl, Cr 0.63, Hgb 13.4, PLT 202, HsTrop 15>70>973. COVID/influenza negative. CXR c/w emphysema without acute findings. EKG with sinus rhythm rate 55bpm, minimal STE in V2 and submmSTE in V3, no TWI.  She was given 324mg  aspirin and SL nitro x3 by EMS prior to arrival. Cardiology asked to evaluate patient.   Hospital Course     Consultants: None   1. NSTEMI: patient presented with chest pain. EKG overall non-ischemic. HsTrop uptrending: 15>70>973. No recent ischemic testing, though  NST in 2011 was negative for ischemia/infarction. Echo in 2015 with EF 60-65%, normal LV diastolic function, no RWMA, and no significant valvular abnormalities. Risk factors for CAD include HTN and tobacco abuse. LDL 85, HgbA1C 5.1. Echo showed EF with 60-65%, indeterminate LV diastolic function, no RWMA, and no significant valvular abnormalities. She was recommended to undergo a LHC which showed tortuous vessels with widely patent coronary arteries. Possible her symptoms were 2/2 coronary artery spasm.  - Continue aspirin 81mg  daily - Will transition to carvedilol 6.25mg  BID for possible spasms   2. Elevated BP without diagnosis of HTN: BP intermittently elevated. She has not had her metoprolol this admission.  - Will transition to carvedilol 6.25mg  BID for improved BP control   3. SVT: no episodes on telemetry. Baseline rhythm is sinus bradycardia. No symptoms to suggest symptomatic bradycardia - Continue metoprolol succinate prn   4. Tobacco abuse: still smoking 1-2 cigarettes a day. We discussed the importance of quitting.  - Continue to encourage smoking cessation.         Did the patient have an acute coronary syndrome (MI, NSTEMI, STEMI, etc) this admission?:  Yes                               AHA/ACC Clinical Performance & Quality Measures: Aspirin prescribed? - Yes ADP Receptor Inhibitor (Plavix/Clopidogrel, Brilinta/Ticagrelor or Effient/Prasugrel) prescribed (includes medically managed patients)? - No - normal coronary arteries Beta Blocker prescribed? - Yes High Intensity Statin (Lipitor 40-80mg  or Crestor 20-40mg ) prescribed? - No - LDL at goal of <100 EF assessed during THIS hospitalization? - Yes For EF <40%, was ACEI/ARB prescribed? - Not Applicable (EF >/= [id number]) For EF <40%, Aldosterone Antagonist (Spironolactone or Eplerenone) prescribed? - Not Applicable (EF >/= [id number]) Cardiac Rehab Phase II ordered (including medically managed patients)? - No - No evidence of CAD        _____________  Discharge Vitals Blood pressure (!) 136/59, pulse 70, temperature 98.6 F (37 C), resp. rate 17, height 5\' 7"  (1.702 m), weight 43.1 kg, SpO2 100 %.  Filed Weights   04/05/20 0822  Weight: 43.1 kg    Labs & Radiologic Studies    CBC Recent Labs    04/04/20 2023  WBC 5.2  HGB 13.4  HCT 39.0  MCV 100.8*  PLT 123XX123   Basic Metabolic Panel Recent Labs    04/04/20 2023 04/05/20 1208  NA 137 138  K 4.1 4.0  CL 99 102  CO2 25 23  GLUCOSE 113* 87  BUN 25* 17  CREATININE 0.63 0.60  CALCIUM 10.0 9.0   Liver Function Tests No results for input(s): AST, ALT, ALKPHOS, BILITOT, PROT, ALBUMIN in the last 72 hours. No results for input(s): LIPASE, AMYLASE in the last 72 hours. High Sensitivity Troponin:   Recent Labs  Lab 04/04/20 2023 04/04/20 2233 04/05/20 0827 04/05/20 1208  TROPONINIHS 15 70* 973* 826*    BNP Invalid input(s): POCBNP D-Dimer No results for input(s): DDIMER in the last 72 hours. Hemoglobin A1C Recent Labs    04/05/20 1209  HGBA1C 5.1   Fasting Lipid Panel Recent Labs    04/05/20 1210  CHOL 161  HDL 66  LDLCALC 85  TRIG 51  CHOLHDL 2.4   Thyroid Function Tests No results for input(s): TSH, T4TOTAL, T3FREE, THYROIDAB in the last 72 hours.  Invalid input(s): FREET3 _____________  DG Chest 2 View  Result Date: 04/04/2020 CLINICAL DATA:  Chest pain for 1 day, SVT, previous tobacco abuse EXAM: CHEST - 2 VIEW COMPARISON:  09/10/2009 FINDINGS: Frontal and lateral views of the chest demonstrate an unremarkable cardiac silhouette. Lungs are hyperinflated with background interstitial prominence consistent with emphysema. No airspace disease, effusion, or pneumothorax. There are no acute bony abnormalities. IMPRESSION: 1. Emphysema, no acute airspace disease. Electronically Signed   By: Sharlet SalinaMichael  Brown M.D.   On: 04/04/2020 20:34   ECHOCARDIOGRAM COMPLETE  Result Date: 04/05/2020    ECHOCARDIOGRAM REPORT   Patient Name:   Meryl DareMARIE C  Furth Date of Exam: 04/05/2020 Medical Rec #:  [id number]       Height:       67.0 in Accession #:    [id number]      Weight:       95.0 lb Date of Birth:  04/05/1950       BSA:          1.474 m Patient Age:    69 years        BP:           162/67 mmHg Patient Gender: F               HR:           53 bpm. Exam Location:  Inpatient Procedure: 2D Echo, Cardiac Doppler and Color Doppler Indications:    NSTEMI  History:        Patient has prior history of Echocardiogram examinations, most                 recent 03/16/2014. Risk Factors:Current Smoker. SVT.  Sonographer:    Ross LudwigArthur Guy RDCS (AE) Referring Phys: [id number] Beatriz StallionKRISTA M. KROEGER IMPRESSIONS  1. Left ventricular ejection fraction, by estimation, is 60 to 65%. The left ventricle has normal function. The left ventricle has no regional wall motion abnormalities. Left ventricular diastolic parameters are indeterminate.  2. Right ventricular systolic function is normal. The right ventricular size is normal. There is normal pulmonary artery systolic pressure. The estimated right ventricular systolic pressure is 34.1 mmHg.  3. The mitral valve is grossly normal. Trivial mitral valve regurgitation. No evidence of mitral stenosis.  4. The aortic valve is tricuspid. Aortic valve regurgitation is not visualized. No aortic stenosis is present.  5. The inferior vena cava is normal in size with greater than 50% respiratory variability, suggesting right atrial pressure of 3 mmHg. FINDINGS  Left Ventricle: Left ventricular ejection fraction, by estimation, is 60 to 65%. The left ventricle has normal function. The left ventricle has no regional wall motion abnormalities. The left ventricular internal cavity size was normal in size. There is  no left ventricular hypertrophy. Left ventricular diastolic parameters are indeterminate. Right Ventricle: The right ventricular size is normal. No increase in right ventricular wall thickness. Right ventricular systolic function is normal.  There is normal pulmonary artery systolic pressure. The tricuspid regurgitant velocity is 2.79 m/s, and  with an assumed right atrial pressure of 3 mmHg, the estimated right ventricular systolic pressure is 34.1 mmHg. Left Atrium: Left atrial size was normal in size. Right Atrium: Right atrial size was normal in size. Pericardium: Trivial pericardial effusion is present. Mitral Valve: The mitral valve is  grossly normal. Trivial mitral valve regurgitation. No evidence of mitral valve stenosis. MV peak gradient, 5.6 mmHg. The mean mitral valve gradient is 1.0 mmHg. Tricuspid Valve: The tricuspid valve is grossly normal. Tricuspid valve regurgitation is mild . No evidence of tricuspid stenosis. Aortic Valve: The aortic valve is tricuspid. Aortic valve regurgitation is not visualized. No aortic stenosis is present. Aortic valve mean gradient measures 2.0 mmHg. Aortic valve peak gradient measures 3.1 mmHg. Aortic valve area, by VTI measures 2.11 cm. Pulmonic Valve: The pulmonic valve was grossly normal. Pulmonic valve regurgitation is not visualized. No evidence of pulmonic stenosis. Aorta: The aortic root is normal in size and structure. Venous: The inferior vena cava is normal in size with greater than 50% respiratory variability, suggesting right atrial pressure of 3 mmHg. IAS/Shunts: The atrial septum is grossly normal.  LEFT VENTRICLE PLAX 2D LVIDd:         3.90 cm  Diastology LVIDs:         2.50 cm  LV e' medial:    7.29 cm/s LV PW:         1.20 cm  LV E/e' medial:  13.7 LV IVS:        0.70 cm  LV e' lateral:   9.14 cm/s LVOT diam:     1.80 cm  LV E/e' lateral: 10.9 LV SV:         47 LV SV Index:   32 LVOT Area:     2.54 cm  RIGHT VENTRICLE             IVC RV Basal diam:  2.90 cm     IVC diam: 1.60 cm RV S prime:     13.35 cm/s TAPSE (M-mode): 2.6 cm LEFT ATRIUM             Index       RIGHT ATRIUM           Index LA diam:        2.90 cm 1.97 cm/m  RA Area:     10.40 cm LA Vol (A2C):   31.3 ml 21.24 ml/m RA  Volume:   22.60 ml  15.33 ml/m LA Vol (A4C):   44.8 ml 30.40 ml/m LA Biplane Vol: 39.8 ml 27.01 ml/m  AORTIC VALVE AV Area (Vmax):    2.22 cm AV Area (Vmean):   2.08 cm AV Area (VTI):     2.11 cm AV Vmax:           88.60 cm/s AV Vmean:          59.900 cm/s AV VTI:            0.223 m AV Peak Grad:      3.1 mmHg AV Mean Grad:      2.0 mmHg LVOT Vmax:         77.20 cm/s LVOT Vmean:        48.900 cm/s LVOT VTI:          0.185 m LVOT/AV VTI ratio: 0.83  AORTA Ao Root diam: 3.40 cm MITRAL VALVE                TRICUSPID VALVE MV Area (PHT): 4.06 cm     TR Peak grad:   31.1 mmHg MV Peak grad:  5.6 mmHg     TR Vmax:        279.00 cm/s MV Mean grad:  1.0 mmHg MV Vmax:       1.18 m/s  SHUNTS MV Vmean:      48.6 cm/s    Systemic VTI:  0.18 m MV Decel Time: 187 msec     Systemic Diam: 1.80 cm MV E velocity: 100.00 cm/s MV A velocity: 54.50 cm/s MV E/A ratio:  1.83 Eleonore Chiquito MD Electronically signed by Eleonore Chiquito MD Signature Date/Time: 04/05/2020/3:05:28 PM    Final    Disposition   Pt is being discharged home today in good condition.  Follow-up Plans & Appointments         Discharge Medications   Allergies as of 04/05/2020   No Known Allergies      Medication List     TAKE these medications    alendronate 70 MG tablet Commonly known as: FOSAMAX Take 1 tablet by mouth once a week.   aspirin 81 MG tablet Take 81 mg by mouth daily.   CALTRATE 600+D PO Take 1 capsule by mouth daily.   carvedilol 6.25 MG tablet Commonly known as: Coreg Take 1 tablet (6.25 mg total) by mouth 2 (two) times daily.   CENTRUM SILVER ADULT 50+ PO Take 1 tablet by mouth daily.   COQ10 PO Take 10 mLs by mouth daily.   Fish Oil 1200 MG Caps Take 1 capsule by mouth daily.   metoprolol succinate 25 MG 24 hr tablet Commonly known as: TOPROL-XL Take 1 tablet (25 mg total) by mouth as needed (for palpitations). What changed:  when to take this reasons to take this   Milk Thistle 200 MG Caps Take  1 capsule by mouth 2 (two) times daily.   nitroGLYCERIN 0.4 MG SL tablet Commonly known as: Nitrostat Place 1 tablet (0.4 mg total) under the tongue every 5 (five) minutes as needed for chest pain.           Outstanding Labs/Studies   None  Duration of Discharge Encounter   Greater than 30 minutes including physician time.  Signed, Abigail Butts, PA-C 04/05/2020, 5:17 PM   I have seen and examined the patient along with Abigail Butts, PA-C.  I have reviewed the chart, notes and new data.  I agree with PA/NP's note.  Key new complaints: now asymptomatic Key examination changes: no complications postop Key new findings / data: essentially normal coronaries on angio, normal echo  PLAN: [place], consider smoking related coronary spasm. Currently asymptomatic. Switch metoprolol to carvedilol. Stop smoking re-evaluate in clinic. At this point, uncertain of statin benefit (LDL 85, HDL 66).  Sanda Klein, MD, [place] [phone] 04/05/2020, 6:33 PM

## 2020-04-05 NOTE — Progress Notes (Signed)
Patient was given discharge instructions. She verbalized understanding. 

## 2020-04-06 ENCOUNTER — Encounter (HOSPITAL_COMMUNITY): Payer: Self-pay | Admitting: Interventional Cardiology

## 2020-04-22 ENCOUNTER — Ambulatory Visit: Payer: Medicare HMO | Admitting: Medical

## 2020-04-23 NOTE — Progress Notes (Deleted)
Cardiology Office Note   Date:  04/23/2020   ID:  Anita, Donovan 05/02/1950, MRN [id number]  PCP:  Bernerd Limbo, MD  Cardiologist:  Kirk Ruths, MD EP: None  No chief complaint on file.     History of Present Illness: Anita Donovan is a 70 y.o. female with a PMH of recent [place] suspected to be 2/2 coronary artery spasm, HTN, SVT, and tobacco abuse, who presents for post-hospital follow-up.  Anita Donovan was recently admitted to the hospital from 04/04/20-04/05/20 after presenting with chest pain, found to have an NSTEMI. HsTrop peaked at 973 and EKG was non-ischemic. Echo showed EF with 60-65%, indeterminate LV diastolic function, no RWMA, and no significant valvular abnormalities. She was recommended to undergo a LHC which showed tortuous vessels with widely patent coronary arteries. Possible her symptoms were 2/2 coronary artery spasm. She was started on carvedilol 6.25mg  BID for possible spasms and improvement in BP, and smoking cessation encouraged.   She presents today for post-hospital follow-up.   1. 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 HTN: BP *** today.  - Continue carvedilol 6.25mg  BID   3. SVT:no recent palpitations - Continue carvedilol as above, as well as metoprolol succinate prn  4. Tobacco abuse:still smoking 1-2 cigarettes a day. We discussed the importance of quitting.  - Continue to encourage smoking cessation.    Past Medical History:  Diagnosis Date  . Allergy   . Arthritis    knee right  . Multiple thyroid nodules   . Osteoporosis   . SVT (supraventricular tachycardia) (H[facility])     Past Surgical History:  Procedure Laterality Date  . COLONOSCOPY    . LEFT HEART CATH AND CORONARY ANGIOGRAPHY N/A 04/05/2020   Procedure: LEFT HEART CATH AND CORONARY ANGIOGRAPHY;  Surgeon: Belva Crome, MD;  Location: [facility];  Service: Cardiovascular;  Laterality: N/A;  .  POLYPECTOMY    . TONSILLECTOMY       Current Outpatient Medications  Medication Sig Dispense Refill  . alendronate (FOSAMAX) 70 MG tablet Take 1 tablet by mouth once a week.  3  . aspirin 81 MG tablet Take 81 mg by mouth daily.    . Calcium Carbonate-Vitamin D (CALTRATE 600+D PO) Take 1 capsule by mouth daily.    . carvedilol (COREG) 6.25 MG tablet Take 1 tablet (6.25 mg total) by mouth 2 (two) times daily. 60 tablet 6  . Coenzyme Q10 (COQ10 PO) Take 10 mLs by mouth daily.    . metoprolol succinate (TOPROL-XL) 25 MG 24 hr tablet Take 1 tablet (25 mg total) by mouth as needed (for palpitations). 30 tablet 3  . Milk Thistle 200 MG CAPS Take 1 capsule by mouth 2 (two) times daily.    . Multiple Vitamins-Minerals (CENTRUM SILVER ADULT 50+ PO) Take 1 tablet by mouth daily.    . nitroGLYCERIN (NITROSTAT) 0.4 MG SL tablet Place 1 tablet (0.4 mg total) under the tongue every 5 (five) minutes as needed for chest pain. 25 tablet 3  . Omega-3 Fatty Acids (FISH OIL) 1200 MG CAPS Take 1 capsule by mouth daily.     No current facility-administered medications for this visit.    Allergies:   Patient has no known allergies.    Social History:  The patient  reports that she quit smoking about 3 years ago. She has never used smokeless tobacco. She reports current alcohol use. She reports that she does not  use drugs.   Family History:  The patient's ***family history includes Heart disease in her mother.    ROS:  Please see the history of present illness.   Otherwise, review of systems are positive for {NONE DEFAULTED:18576::"none"}.   All other systems are reviewed and negative.    PHYSICAL EXAM: VS:  There were no vitals taken for this visit. , BMI There is no height or weight on file to calculate BMI. GEN: Well nourished, well developed, in no acute distress HEENT: normal Neck: no JVD, carotid bruits, or masses Cardiac: ***RRR; no murmurs, rubs, or gallops,no edema  Respiratory:  clear to  auscultation bilaterally, normal work of breathing GI: soft, nontender, nondistended, + BS MS: no deformity or atrophy Skin: warm and dry, no rash Neuro:  Strength and sensation are intact Psych: euthymic mood, full affect   EKG:  EKG {ACTION; IS/IS ALP:37902409} ordered today. The ekg ordered today demonstrates ***   Recent Labs: 04/04/2020: Hemoglobin 13.4; Platelets 202 04/05/2020: BUN 17; Creatinine, Ser 0.60; Potassium 4.0; Sodium 138    Lipid Panel    Component Value Date/Time   CHOL 161 04/05/2020 1210   TRIG 51 04/05/2020 1210   HDL 66 04/05/2020 1210   CHOLHDL 2.4 04/05/2020 1210   VLDL 10 04/05/2020 1210   LDLCALC 85 04/05/2020 1210      Wt Readings from Last 3 Encounters:  04/05/20 95 lb (43.1 kg)  11/06/19 96 lb (43.5 kg)  09/22/18 102 lb 4.8 oz (46.4 kg)      Other studies Reviewed: Additional studies/ records that were reviewed today include:   Echocardiogram 04/05/20: 1. Left ventricular ejection fraction, by estimation, is 60 to 65%. The  left ventricle has normal function. The left ventricle has no regional  wall motion abnormalities. Left ventricular diastolic parameters are  indeterminate.  2. Right ventricular systolic function is normal. The right ventricular  size is normal. There is normal pulmonary artery systolic pressure. The  estimated right ventricular systolic pressure is 73.5 mmHg.  3. The mitral valve is grossly normal. Trivial mitral valve  regurgitation. No evidence of mitral stenosis.  4. The aortic valve is tricuspid. Aortic valve regurgitation is not  visualized. No aortic stenosis is present.  5. The inferior vena cava is normal in size with greater than 50%  respiratory variability, suggesting right atrial pressure of 3 mmHg.  Left heart catheterization 04/05/20:  Normal left main  Tortuous but widely patent LAD  Widely patent circumflex.  Vessel tortuosity is noted.  Dominant right coronary with vessel tortuosity noted.   No obstructive disease  Normal left ventricular systolic function and LVEDP  RECOMMENDATIONS:   No obvious evidence of obstructive disease, coronary dissection, and an absence of wall motion abnormality stress cardiomyopathy seems unlikely.  Context of her presentation, [place] and myocarditis are considerations.  Aggressive risk factor modification with close clinical follow-up.      ASSESSMENT AND PLAN:  1.  ***   Current medicines are reviewed at length with the patient today.  The patient {ACTIONS; HAS/DOES NOT HAVE:19233} concerns regarding medicines.  The following changes have been made:  {PLAN; NO CHANGE:13088:s}  Labs/ tests ordered today include: *** No orders of the defined types were placed in this encounter.    Disposition:   FU with *** in {gen number 3-29:924268} {Days to years:10300}  Signed, Abigail Butts, PA-C  04/23/2020 11:24 PM

## 2020-04-28 ENCOUNTER — Ambulatory Visit: Payer: Medicare HMO | Admitting: Medical

## 2020-04-28 DIAGNOSIS — I201 Angina pectoris with documented spasm: Secondary | ICD-10-CM

## 2020-04-28 DIAGNOSIS — Z720 Tobacco use: Secondary | ICD-10-CM

## 2020-04-28 DIAGNOSIS — I10 Essential (primary) hypertension: Secondary | ICD-10-CM

## 2020-04-28 DIAGNOSIS — I471 Supraventricular tachycardia: Secondary | ICD-10-CM

## 2020-05-01 NOTE — Progress Notes (Signed)
Cardiology Office Note:   Date:  05/02/2020  NAME:  Anita Donovan    MRN: [id number] DOB:  Feb 09, 1951   PCP:  Bernerd Limbo, MD  Cardiologist:  Kirk Ruths, MD   Referring MD: Bernerd Limbo, MD   Chief Complaint  Patient presents with  . Follow-up   History of Present Illness:   Anita Donovan is a 70 y.o. female with a hx of NSTEMI (normal LHC 04/05/2020), HTN, SVT, tobacco abuse who presents for follow-up. Admitted with NSTEMI 04/05/2020 and had normal LHC. Symptoms attributed to vasospasm.  She had no further recurrence of chest pain.  Her EKG demonstrates normal sinus rhythm with an old anteroseptal infarct.  She has no acute ischemic changes or evidence of infarction.  She reports she is not exercising routinely but has no limitations.  No issues at her right radial cath site.  She does have some scar tissue over the cath site but this appears to be not an issue for her.  Pulse is normal and regular.  She reports no significant stress around the time of her chest pain symptoms.  She was smoking on and off but is quit since then.  Symptoms were attributed to vasospasm.  She was placed on Coreg and is doing well.  No recent TSH in our system.  I recommended we check that today.  She apparently has had thyroid issues in the past.  She has had nodules that were followed but have been stable.  Cardiac MRI was not pursued in the hospital.  Labs really unremarkable.  She overall is doing well.  Possibly this was related to vasospasm.  She also reports that she had some palpitations today before.  She has a history of SVT.  No major issues reported today.  Problem List 1. NSTEMI 04/05/2020 -normal LHC  -vasospasm? 2. HTN 3. SVT 4. Former Tobacco abuse   Past Medical History: Past Medical History:  Diagnosis Date  . Allergy   . Arthritis    knee right  . Multiple thyroid nodules   . Osteoporosis   . SVT (supraventricular tachycardia) (HCC)     Past Surgical History: Past Surgical  History:  Procedure Laterality Date  . COLONOSCOPY    . LEFT HEART CATH AND CORONARY ANGIOGRAPHY N/A 04/05/2020   Procedure: LEFT HEART CATH AND CORONARY ANGIOGRAPHY;  Surgeon: Belva Crome, MD;  Location: [place] [facility];  Service: Cardiovascular;  Laterality: N/A;  . POLYPECTOMY    . TONSILLECTOMY      Current Medications: Current Meds  Medication Sig  . alendronate (FOSAMAX) 70 MG tablet Take 1 tablet by mouth once a week.  Marland Kitchen aspirin 81 MG tablet Take 81 mg by mouth daily.  . Calcium Carbonate-Vitamin D (CALTRATE 600+D PO) Take 1 capsule by mouth daily.  . carvedilol (COREG) 6.25 MG tablet Take 1 tablet (6.25 mg total) by mouth 2 (two) times daily.  . Coenzyme Q10 (COQ10 PO) Take 10 mLs by mouth daily.  . metoprolol succinate (TOPROL-XL) 25 MG 24 hr tablet Take 1 tablet (25 mg total) by mouth as needed (for palpitations).  . Milk Thistle 200 MG CAPS Take 1 capsule by mouth 2 (two) times daily.  . Multiple Vitamins-Minerals (CENTRUM SILVER ADULT 50+ PO) Take 1 tablet by mouth daily.  . nitroGLYCERIN (NITROSTAT) 0.4 MG SL tablet Place 1 tablet (0.4 mg total) under the tongue every 5 (five) minutes as needed for chest pain.  . Omega-3 Fatty Acids (FISH OIL) 1200 MG CAPS  Take 1 capsule by mouth daily.    Allergies:    Patient has no known allergies.   Social History: Social History   Socioeconomic History  . Marital status: Married    Spouse name: Not on file  . Number of children: Not on file  . Years of education: Not on file  . Highest education level: Not on file  Occupational History  . Not on file  Tobacco Use  . Smoking status: Former Smoker    Quit date: 04/08/2017    Years since quitting: 3.0  . Smokeless tobacco: Never Used  Substance and Sexual Activity  . Alcohol use: Yes    Alcohol/week: 0.0 standard drinks    Comment: 2 glasses per night  . Drug use: Never  . Sexual activity: Not on file  Other Topics Concern  . Not on file  Social History Narrative  .  Not on file   Social Determinants of Health   Financial Resource Strain: Not on file  Food Insecurity: Not on file  Transportation Needs: Not on file  Physical Activity: Not on file  Stress: Not on file  Social Connections: Not on file    Family History: The patient's family history includes Heart disease in her mother. There is no history of Colon cancer, Colon polyps, Esophageal cancer, Rectal cancer, or Stomach cancer.  ROS:   All other ROS reviewed and negative. Pertinent positives noted in the HPI.     EKGs/Labs/Other Studies Reviewed:   The following studies were personally reviewed by me today:  EKG:  EKG is ordered today.  The ekg ordered today demonstrates sinus bradycardia heart rate 54, sinus arrhythmia, septal infarct, and was personally reviewed by me.   LHC 04/05/2020   Normal left main  Tortuous but widely patent LAD  Widely patent circumflex.  Vessel tortuosity is noted.  Dominant right coronary with vessel tortuosity noted.  No obstructive disease  Normal left ventricular systolic function and LVEDP  RECOMMENDATIONS:   No obvious evidence of obstructive disease, coronary dissection, and an absence of wall motion abnormality stress cardiomyopathy seems unlikely.  Context of her presentation, Confluence and myocarditis are considerations.  Aggressive risk factor modification with close clinical follow-up.  TTE 04/05/2020 1. Left ventricular ejection fraction, by estimation, is 60 to 65%. The  left ventricle has normal function. The left ventricle has no regional  wall motion abnormalities. Left ventricular diastolic parameters are  indeterminate.  2. Right ventricular systolic function is normal. The right ventricular  size is normal. There is normal pulmonary artery systolic pressure. The  estimated right ventricular systolic pressure is 28.3 mmHg.  3. The mitral valve is grossly normal. Trivial mitral valve  regurgitation. No evidence of mitral  stenosis.  4. The aortic valve is tricuspid. Aortic valve regurgitation is not  visualized. No aortic stenosis is present.  5. The inferior vena cava is normal in size with greater than 50%  respiratory variability, suggesting right atrial pressure of 3 mmHg.   Recent Labs: 04/04/2020: Hemoglobin 13.4; Platelets 202 04/05/2020: BUN 17; Creatinine, Ser 0.60; Potassium 4.0; Sodium 138   Recent Lipid Panel    Component Value Date/Time   CHOL 161 04/05/2020 1210   TRIG 51 04/05/2020 1210   HDL 66 04/05/2020 1210   CHOLHDL 2.4 04/05/2020 1210   VLDL 10 04/05/2020 1210   LDLCALC 85 04/05/2020 1210    Physical Exam:   VS:  BP 120/62   Pulse (!) 54   Ht 5\' 7"  (1.702 m)  Wt 98 lb 9.6 oz (44.7 kg)   SpO2 96%   BMI 15.44 kg/m    Wt Readings from Last 3 Encounters:  05/02/20 98 lb 9.6 oz (44.7 kg)  04/05/20 95 lb (43.1 kg)  11/06/19 96 lb (43.5 kg)    General: Well nourished, well developed, in no acute distress Head: Atraumatic, normal size  Eyes: PEERLA, EOMI  Neck: Supple, no JVD Endocrine: No thryomegaly Cardiac: Normal S1, S2; RRR; no murmurs, rubs, or gallops Lungs: Clear to auscultation bilaterally, no wheezing, rhonchi or rales  Abd: Soft, nontender, no hepatomegaly  Ext: No edema, pulses 2+ Musculoskeletal: No deformities, BUE and BLE strength normal and equal Skin: Warm and dry, no rashes   Neuro: Alert and oriented to person, place, time, and situation, CNII-XII grossly intact, no focal deficits  Psych: Normal mood and affect   ASSESSMENT:   Anita Donovan is a 70 y.o. female who presents for the following: 1. NSTEMI (non-ST elevated myocardial infarction) (HCC)   2. SVT (supraventricular tachycardia) (HCC)   3. Tobacco abuse     PLAN:   1. NSTEMI (non-ST elevated myocardial infarction) (HCC) -Admitted to the hospital on 04/05/2020 with chest pain and mildly elevated troponin.  Left heart catheterization showed normal coronary arteries.  Symptoms were attributed  to vasospasm and she was started on Coreg.  No further symptoms.  I recommended she continue Coreg.   -Cardiac MRI was not pursued.  No symptoms concerning for myocarditis.  No real risk factors for this.  Given that she is had no further symptoms I see no need for this. -She was smoking.  She is stopped.  Possibly this created vasospasm. -No recent TSH.  I recommend we check that.  She apparently has a history of thyroid nodules and we need to make sure her thyroid function is okay.  This could be an issue and explain some of her symptoms.  We will see if labs are abnormal.  2. SVT (supraventricular tachycardia) (HCC) -No recurrence.  3. Tobacco abuse -Smoking cessation counseling provided.  Disposition: Return in about 6 months (around 10/30/2020).  Medication Adjustments/Labs and Tests Ordered: Current medicines are reviewed at length with the patient today.  Concerns regarding medicines are outlined above.  Orders Placed This Encounter  Procedures  . TSH  . EKG 12-Lead   No orders of the defined types were placed in this encounter.   Patient Instructions  Medication Instructions:  The current medical regimen is effective;  continue present plan and medications.  *If you need a refill on your cardiac medications before your next appointment, please call your pharmacy*   Lab Work: TSH today  If you have labs (blood work) drawn today and your tests are completely normal, you will receive your results only by: Marland Kitchen. MyChart Message (if you have MyChart) OR . A paper copy in the mail If you have any lab test that is abnormal or we need to change your treatment, we will call you to review the results.   Follow-Up: At [facility], you and your health needs are our priority.  As part of our continuing mission to provide you with exceptional heart care, we have created designated Provider Care Teams.  These Care Teams include your primary Cardiologist (physician) and Advanced  Practice Providers (APPs -  Physician Assistants and Nurse Practitioners) who all work together to provide you with the care you need, when you need it.  We recommend signing up for the patient portal called "MyChart".  Sign up information is provided on this After Visit Summary.  MyChart is used to connect with patients for Virtual Visits (Telemedicine).  Patients are able to view lab/test results, encounter notes, upcoming appointments, etc.  Non-urgent messages can be sent to your provider as well.   To learn more about what you can do with MyChart, go to [email or web address].    Your next appointment:   6 month(s)  The format for your next appointment:   In Person  Provider:   Kirk Ruths, MD         Time Spent with Patient: I have spent a total of 25 minutes with patient reviewing hospital notes, telemetry, EKGs, labs and examining the patient as well as establishing an assessment and plan that was discussed with the patient.  > 50% of time was spent in direct patient care.  Signed, Addison Naegeli. Audie Box, [place]  [address] [place], [place] [address] [phone]  05/02/2020 3:40 PM

## 2020-05-02 ENCOUNTER — Other Ambulatory Visit: Payer: Self-pay

## 2020-05-02 ENCOUNTER — Ambulatory Visit: Payer: Medicare HMO | Admitting: Cardiovascular Disease

## 2020-05-02 ENCOUNTER — Encounter: Payer: Self-pay | Admitting: Cardiovascular Disease

## 2020-05-02 VITALS — BP 120/62 | HR 54 | Ht 67.0 in | Wt 98.6 lb

## 2020-05-02 DIAGNOSIS — Z720 Tobacco use: Secondary | ICD-10-CM

## 2020-05-02 DIAGNOSIS — R002 Palpitations: Secondary | ICD-10-CM

## 2020-05-02 DIAGNOSIS — I214 Non-ST elevation (NSTEMI) myocardial infarction: Secondary | ICD-10-CM

## 2020-05-02 DIAGNOSIS — I471 Supraventricular tachycardia: Secondary | ICD-10-CM | POA: Diagnosis not present

## 2020-05-02 NOTE — Patient Instructions (Signed)
Medication Instructions:  The current medical regimen is effective;  continue present plan and medications.  *If you need a refill on your cardiac medications before your next appointment, please call your pharmacy*   Lab Work: TSH today  If you have labs (blood work) drawn today and your tests are completely normal, you will receive your results only by: Marland Kitchen MyChart Message (if you have MyChart) OR . A paper copy in the mail If you have any lab test that is abnormal or we need to change your treatment, we will call you to review the results.   Follow-Up: At [facility], you and your health needs are our priority.  As part of our continuing mission to provide you with exceptional heart care, we have created designated Provider Care Teams.  These Care Teams include your primary Cardiologist (physician) and Advanced Practice Providers (APPs -  Physician Assistants and Nurse Practitioners) who all work together to provide you with the care you need, when you need it.  We recommend signing up for the patient portal called "MyChart".  Sign up information is provided on this After Visit Summary.  MyChart is used to connect with patients for Virtual Visits (Telemedicine).  Patients are able to view lab/test results, encounter notes, upcoming appointments, etc.  Non-urgent messages can be sent to your provider as well.   To learn more about what you can do with MyChart, go to [email or web address].    Your next appointment:   6 month(s)  The format for your next appointment:   In Person  Provider:   Kirk Ruths, MD

## 2020-05-03 LAB — TSH: TSH: 6.18 u[IU]/mL — ABNORMAL HIGH (ref 0.450–4.500)

## 2020-08-11 DIAGNOSIS — E039 Hypothyroidism, unspecified: Secondary | ICD-10-CM | POA: Insufficient documentation

## 2020-08-11 DIAGNOSIS — I252 Old myocardial infarction: Secondary | ICD-10-CM | POA: Insufficient documentation

## 2020-09-02 ENCOUNTER — Encounter: Payer: Self-pay | Admitting: *Deleted

## 2020-09-02 NOTE — Progress Notes (Signed)
Osvaldo Angst, CRNA  Levonne Spiller, RN Shah Insley,   This pt is cleared for anesthetic care at [facility].   Thanks,   Osvaldo Angst        Previous Messages   ----- Message -----  From: Levonne Spiller, RN  Sent: 09/02/2020 11:58 AM EDT  To: Osvaldo Angst, CRNA   John,   Please review this patient's chart. NSTEMI 04/05/2020, no blood thinners. OK to proceed with the colonoscopy on 10/04/2020? Please advise. Thank you, Aanika Defoor pv

## 2020-09-29 ENCOUNTER — Other Ambulatory Visit: Payer: Self-pay | Admitting: Medical

## 2020-09-29 NOTE — Telephone Encounter (Signed)
Rx(s) sent to pharmacy electronically.  

## 2020-09-29 NOTE — Telephone Encounter (Signed)
This is Dr. Crenshaw's pt 

## 2020-10-04 ENCOUNTER — Encounter: Payer: Medicare HMO | Admitting: Internal Medicine

## 2020-10-25 NOTE — Progress Notes (Signed)
HPI: FU palpitations. The patient was seen by Dr. Rollene Fare in June 2011 following an episode of palpitations requiring evaluation in the emergency room. There were apparently no rhythm strips available and he felt as though she may be having SVT. Abdominal ultrasound March 2016 showed no aneurysm.  Admitted January 2022 with mildly elevated troponin and chest pain.  Echocardiogram January 2022 showed normal LV function.  Cardiac catheterization January 2022 showed vessel tortuosity but no obstructive coronary disease.  Symptoms felt possibly secondary to vasospasm.  Since last seen, she lost her husband 6 weeks ago.  She is appropriately grieving.  She denies increased dyspnea.  No exertional chest pain.  She did have an episode of palpitations that resolved with her Toprol.  Current Outpatient Medications  Medication Sig Dispense Refill   alendronate (FOSAMAX) 70 MG tablet Take 1 tablet by mouth once a week.  3   aspirin 81 MG tablet Take 81 mg by mouth daily.     Calcium Carbonate-Vitamin D (CALTRATE 600+D PO) Take 1 capsule by mouth daily.     carvedilol (COREG) 6.25 MG tablet TAKE 1 TABLET(6.25 MG) BY MOUTH TWICE DAILY 180 tablet 1   Coenzyme Q10 (COQ10 PO) Take 10 mLs by mouth daily.     levothyroxine (SYNTHROID) 50 MCG tablet Take 50 mcg by mouth daily before breakfast.     metoprolol succinate (TOPROL-XL) 25 MG 24 hr tablet Take 1 tablet (25 mg total) by mouth as needed (for palpitations). 30 tablet 3   Milk Thistle 200 MG CAPS Take 1 capsule by mouth 2 (two) times daily.     Multiple Vitamins-Minerals (CENTRUM SILVER ADULT 50+ PO) Take 1 tablet by mouth daily.     nitroGLYCERIN (NITROSTAT) 0.4 MG SL tablet Place 1 tablet (0.4 mg total) under the tongue every 5 (five) minutes as needed for chest pain. 25 tablet 3   Omega-3 Fatty Acids (FISH OIL) 1200 MG CAPS Take 1 capsule by mouth daily.     No current facility-administered medications for this visit.     Past Medical History:   Diagnosis Date   Allergy    Arthritis    knee right   Multiple thyroid nodules    Osteoporosis    SVT (supraventricular tachycardia) (HCC)     Past Surgical History:  Procedure Laterality Date   COLONOSCOPY     LEFT HEART CATH AND CORONARY ANGIOGRAPHY N/A 04/05/2020   Procedure: LEFT HEART CATH AND CORONARY ANGIOGRAPHY;  Surgeon: Belva Crome, MD;  Location: [place] [facility];  Service: Cardiovascular;  Laterality: N/A;   POLYPECTOMY     TONSILLECTOMY      Social History   Socioeconomic History   Marital status: Married    Spouse name: Not on file   Number of children: Not on file   Years of education: Not on file   Highest education level: Not on file  Occupational History   Not on file  Tobacco Use   Smoking status: Former    Types: Cigarettes    Quit date: 04/08/2017    Years since quitting: 3.5   Smokeless tobacco: Never  Substance and Sexual Activity   Alcohol use: Yes    Alcohol/week: 0.0 standard drinks    Comment: 2 glasses per night   Drug use: Never   Sexual activity: Not on file  Other Topics Concern   Not on file  Social History Narrative   Not on file   Social Determinants of Health  Financial Resource Strain: Not on file  Food Insecurity: Not on file  Transportation Needs: Not on file  Physical Activity: Not on file  Stress: Not on file  Social Connections: Not on file  Intimate Partner Violence: Not on file    Family History  Problem Relation Age of Onset   Heart disease Mother        Stents   Colon cancer Neg Hx    Colon polyps Neg Hx    Esophageal cancer Neg Hx    Rectal cancer Neg Hx    Stomach cancer Neg Hx     ROS: no fevers or chills, productive cough, hemoptysis, dysphasia, odynophagia, melena, hematochezia, dysuria, hematuria, rash, seizure activity, orthopnea, PND, pedal edema, claudication. Remaining systems are negative.  Physical Exam: Well-developed well-nourished in no acute distress.  Skin is warm and dry.  HEENT  is normal.  Neck is supple.  Chest is clear to auscultation with normal expansion.  Cardiovascular exam is regular rate and rhythm.  Abdominal exam nontender or distended. No masses palpated. Extremities show no edema. neuro grossly intact  A/P  1 history of question non-ST elevation myocardial infarction-catheterization revealed no coronary disease and ejection fraction normal.  No recurrences.  2 question history of SVT-we will continue beta-blocker at present dose.  Can consider monitor in the future for recurrent symptoms.  3 history of palpitations-continue beta-blocker.  Kirk Ruths, MD

## 2020-11-07 ENCOUNTER — Encounter: Payer: Self-pay | Admitting: Cardiology

## 2020-11-07 ENCOUNTER — Ambulatory Visit: Payer: Medicare HMO | Admitting: Cardiology

## 2020-11-07 ENCOUNTER — Other Ambulatory Visit: Payer: Self-pay

## 2020-11-07 VITALS — BP 138/76 | HR 65 | Ht 67.0 in | Wt 104.0 lb

## 2020-11-07 DIAGNOSIS — R002 Palpitations: Secondary | ICD-10-CM | POA: Diagnosis not present

## 2020-11-07 DIAGNOSIS — I471 Supraventricular tachycardia: Secondary | ICD-10-CM | POA: Diagnosis not present

## 2020-11-07 NOTE — Patient Instructions (Signed)

## 2020-11-12 ENCOUNTER — Emergency Department (HOSPITAL_BASED_OUTPATIENT_CLINIC_OR_DEPARTMENT_OTHER): Payer: Medicare HMO

## 2020-11-12 ENCOUNTER — Emergency Department (HOSPITAL_BASED_OUTPATIENT_CLINIC_OR_DEPARTMENT_OTHER)
Admission: EM | Admit: 2020-11-12 | Discharge: 2020-11-12 | Disposition: A | Discharge status: Home / Self Care | Payer: Medicare HMO | Attending: Emergency Medicine | Admitting: Emergency Medicine

## 2020-11-12 ENCOUNTER — Encounter (HOSPITAL_BASED_OUTPATIENT_CLINIC_OR_DEPARTMENT_OTHER): Payer: Self-pay | Admitting: Emergency Medicine

## 2020-11-12 ENCOUNTER — Other Ambulatory Visit: Payer: Self-pay

## 2020-11-12 DIAGNOSIS — J069 Acute upper respiratory infection, unspecified: Secondary | ICD-10-CM

## 2020-11-12 DIAGNOSIS — Z79899 Other long term (current) drug therapy: Secondary | ICD-10-CM | POA: Diagnosis not present

## 2020-11-12 DIAGNOSIS — Z87891 Personal history of nicotine dependence: Secondary | ICD-10-CM | POA: Insufficient documentation

## 2020-11-12 DIAGNOSIS — R0602 Shortness of breath: Secondary | ICD-10-CM | POA: Diagnosis present

## 2020-11-12 DIAGNOSIS — U071 COVID-19: Secondary | ICD-10-CM | POA: Diagnosis not present

## 2020-11-12 DIAGNOSIS — J40 Bronchitis, not specified as acute or chronic: Secondary | ICD-10-CM

## 2020-11-12 LAB — COMPREHENSIVE METABOLIC PANEL
ALT: 17 U/L (ref 0–44)
AST: 24 U/L (ref 15–41)
Albumin: 4 g/dL (ref 3.5–5.0)
Alkaline Phosphatase: 75 U/L (ref 38–126)
Anion gap: 12 (ref 5–15)
BUN: 15 mg/dL (ref 8–23)
CO2: 25 mmol/L (ref 22–32)
Calcium: 9.2 mg/dL (ref 8.9–10.3)
Chloride: 97 mmol/L — ABNORMAL LOW (ref 98–111)
Creatinine, Ser: 0.68 mg/dL (ref 0.44–1.00)
GFR, Estimated: >60 mL/min (ref >60)
Glucose, Bld: 115 mg/dL — ABNORMAL HIGH (ref 70–99)
Potassium: 4.2 mmol/L (ref 3.5–5.1)
Sodium: 134 mmol/L — ABNORMAL LOW (ref 135–145)
Total Bilirubin: 0.7 mg/dL (ref 0.3–1.2)
Total Protein: 7.3 g/dL (ref 6.5–8.1)

## 2020-11-12 LAB — RESP PANEL BY RT-PCR (FLU A&B, COVID) ARPGX2
Influenza A by PCR: NEGATIVE
Influenza B by PCR: NEGATIVE
SARS Coronavirus 2 by RT PCR: NEGATIVE

## 2020-11-12 LAB — CBC WITH DIFFERENTIAL/PLATELET
Abs Immature Granulocytes: 0.02 10*3/uL (ref 0.00–0.07)
Basophils Absolute: 0 10*3/uL (ref 0.0–0.1)
Basophils Relative: 1 %
Eosinophils Absolute: 0.1 10*3/uL (ref 0.0–0.5)
Eosinophils Relative: 1 %
HCT: 41.3 % (ref 36.0–46.0)
Hemoglobin: 14.3 g/dL (ref 12.0–15.0)
Immature Granulocytes: 0 %
Lymphocytes Relative: 11 %
Lymphs Abs: 0.8 10*3/uL (ref 0.7–4.0)
MCH: 33.3 pg (ref 26.0–34.0)
MCHC: 34.6 g/dL (ref 30.0–36.0)
MCV: 96.3 fL (ref 80.0–100.0)
Monocytes Absolute: 0.5 10*3/uL (ref 0.1–1.0)
Monocytes Relative: 6 %
Neutro Abs: 5.9 10*3/uL (ref 1.7–7.7)
Neutrophils Relative %: 81 %
Platelets: 250 10*3/uL (ref 150–400)
RBC: 4.29 MIL/uL (ref 3.87–5.11)
RDW: 11.7 % (ref 11.5–15.5)
WBC: 7.3 10*3/uL (ref 4.0–10.5)
nRBC: 0 % (ref 0.0–0.2)

## 2020-11-12 LAB — BRAIN NATRIURETIC PEPTIDE: B Natriuretic Peptide: 136.1 pg/mL — ABNORMAL HIGH (ref 0.0–100.0)

## 2020-11-12 MED ORDER — PROMETHAZINE-DM 6.25-15 MG/5ML PO SYRP: 5.0000 mL | ORAL_SOLUTION | Freq: Four times a day (QID) | ORAL | 0 refills | Status: DC | PRN

## 2020-11-12 MED ORDER — PREDNISONE 50 MG PO TABS
60.0000 mg | ORAL_TABLET | Freq: Once | ORAL | Status: AC
  Administered 2020-11-12: 60 mg via ORAL
  Filled 2020-11-12: qty 1

## 2020-11-12 MED ORDER — PREDNISONE 20 MG PO TABS: 40.0000 mg | ORAL_TABLET | Freq: Every day | ORAL | 0 refills | Status: AC

## 2020-11-12 MED ORDER — BENZONATATE 100 MG PO CAPS: 100.0000 mg | ORAL_CAPSULE | Freq: Three times a day (TID) | ORAL | 0 refills | Status: DC

## 2020-11-12 MED ORDER — ALBUTEROL SULFATE HFA 108 (90 BASE) MCG/ACT IN AERS
2.0000 | INHALATION_SPRAY | Freq: Once | RESPIRATORY_TRACT | Status: AC
  Administered 2020-11-12: 2 via RESPIRATORY_TRACT
  Filled 2020-11-12: qty 6.7

## 2020-11-12 MED ORDER — PREDNISONE 20 MG PO TABS: 40.0000 mg | ORAL_TABLET | Freq: Every day | ORAL | 0 refills | Status: DC

## 2020-11-12 NOTE — Discharge Instructions (Addendum)
You likely have a viral illness.  This should be treated symptomatically. Take prednisone as prescribed for the next 5 days starting tomorrow Use the albuterol inhaler, 2 puffs, every 4 hours while awake for the next 2 days.  After this, use as needed for shortness of breath, chest tightness, wheezing, coughing fits. Use cough drops and syrup as prescribed. Use Tylenol or ibuprofen as needed for fevers or body aches. Make sure you stay well-hydrated with water. Wash your hands frequently to prevent spread of infection. Follow-up with your primary care doctor in 1 week if your symptoms are not improving. Return to the emergency room if you develop chest pain, difficulty breathing, or any new or worsening symptoms.

## 2020-11-12 NOTE — ED Triage Notes (Signed)
Pt arrives pov with driver, c/o shob and cough x 4 days. Pt denies CP

## 2020-11-12 NOTE — ED Provider Notes (Signed)
[facility] EMERGENCY DEPARTMENT Provider Note   CSN: [id number] Arrival date & time: 11/12/20  0941     History No chief complaint on file.   Anita Donovan is a 70 y.o. female presenting for evaluation of nasal congestion, cough, shortness of breath.  Patient states she has symptoms for 4 days.  She was reaching around multiple people who are sick, none who are COVID-positive.  Over the past 4 days, her symptoms have worsened, and she is feeling increased shortness of breath and increased coughing today.  She is not taking anything for her symptoms.  She denies fevers, chills, chest pain, nausea, vomiting, abdominal pain, urinary symptoms, normal bowel movements.  No history of COPD or asthma.  No history of heart failure.  She does have a history of SVT, takes atenolol as needed.  She has not felt any palpitations or heart racing.  She is vaccinated for COVID.  HPI     Past Medical History:  Diagnosis Date   Allergy    Arthritis    knee right   Multiple thyroid nodules    Osteoporosis    SVT (supraventricular tachycardia) [facility])     Patient Active Problem List   Diagnosis Date Noted   NSTEMI (non-ST elevated myocardial infarction) ([place]) 04/05/2020   Bruit 05/21/2014   Palpitations 02/12/2014   SVT (supraventricular tachycardia) ([place]) 02/12/2014   Tobacco abuse 02/12/2014    Past Surgical History:  Procedure Laterality Date   COLONOSCOPY     LEFT HEART CATH AND CORONARY ANGIOGRAPHY N/A 04/05/2020   Procedure: LEFT HEART CATH AND CORONARY ANGIOGRAPHY;  Surgeon: Belva Crome, MD;  Location: [facility];  Service: Cardiovascular;  Laterality: N/A;   POLYPECTOMY     TONSILLECTOMY       OB History   No obstetric history on file.     Family History  Problem Relation Age of Onset   Heart disease Mother        Stents   Colon cancer Neg Hx    Colon polyps Neg Hx    Esophageal cancer Neg Hx    Rectal cancer Neg Hx    Stomach cancer Neg Hx      Social History   Tobacco Use   Smoking status: Former    Types: Cigarettes    Quit date: 04/08/2017    Years since quitting: 3.6   Smokeless tobacco: Never  Substance Use Topics   Alcohol use: Yes    Alcohol/week: 0.0 standard drinks    Comment: 2 glasses per night   Drug use: Never    Home Medications Prior to Admission medications   Medication Sig Start Date End Date Taking? Authorizing Provider  benzonatate (TESSALON) 100 MG capsule Take 1 capsule (100 mg total) by mouth every 8 (eight) hours. 11/12/20  Yes Kourtnie Sachs, PA-C  predniSONE (DELTASONE) 20 MG tablet Take 2 tablets (40 mg total) by mouth daily for 5 days. 11/13/20 11/18/20 Yes Walther Sanagustin, PA-C  promethazine-dextromethorphan (PROMETHAZINE-DM) 6.25-15 MG/5ML syrup Take 5 mLs by mouth 4 (four) times daily as needed for cough. 11/12/20  Yes Keshon Markovitz, PA-C  alendronate (FOSAMAX) 70 MG tablet Take 1 tablet by mouth once a week. 04/20/16   [provider]  aspirin 81 MG tablet Take 81 mg by mouth daily.    [provider]  Calcium Carbonate-Vitamin D (CALTRATE 600+D PO) Take 1 capsule by mouth daily.    [provider]  carvedilol (COREG) 6.25 MG tablet TAKE 1  TABLET(6.25 MG) BY MOUTH TWICE DAILY 09/29/20   Lelon Perla, MD  Coenzyme Q10 (COQ10 PO) Take 10 mLs by mouth daily.    [provider]  levothyroxine (SYNTHROID) 50 MCG tablet Take 50 mcg by mouth daily before breakfast.    [provider]  metoprolol succinate (TOPROL-XL) 25 MG 24 hr tablet Take 1 tablet (25 mg total) by mouth as needed (for palpitations). 04/05/20   Kroeger, Lorelee Cover., PA-C  Milk Thistle 200 MG CAPS Take 1 capsule by mouth 2 (two) times daily.    [provider]  Multiple Vitamins-Minerals (CENTRUM SILVER ADULT 50+ PO) Take 1 tablet by mouth daily.    [provider]  nitroGLYCERIN (NITROSTAT) 0.4 MG SL tablet Place 1 tablet (0.4 mg total) under the tongue every 5  (five) minutes as needed for chest pain. 04/05/20 04/05/21  Kroeger, Lorelee Cover., PA-C  Omega-3 Fatty Acids (FISH OIL) 1200 MG CAPS Take 1 capsule by mouth daily.    [provider]    Allergies    Patient has no known allergies.  Review of Systems   Review of Systems  HENT:  Positive for congestion.   Respiratory:  Positive for cough and shortness of breath.   All other systems reviewed and are negative.  Physical Exam Updated Vital Signs BP 136/72   Pulse 63   Temp 98.7 F (37.1 C)   Resp (!) 24   Ht '5\' 7"'$  (1.702 m)   Wt 46.7 kg   SpO2 95%   BMI 16.13 kg/m   Physical Exam Vitals and nursing note reviewed.  Constitutional:      General: She is not in acute distress.    Appearance: Normal appearance.     Comments: Resting in the bed in NAD  HENT:     Head: Normocephalic and atraumatic.  Eyes:     Conjunctiva/sclera: Conjunctivae normal.     Pupils: Pupils are equal, round, and reactive to light.  Cardiovascular:     Rate and Rhythm: Normal rate and regular rhythm.     Pulses: Normal pulses.  Pulmonary:     Effort: Pulmonary effort is normal. No respiratory distress.     Breath sounds: Normal breath sounds. No wheezing.     Comments: Speaking in full sentences.  Clear lung sounds in all fields. Nonproductive cough noted on exam. SpO2 95% on RA on my eval Abdominal:     General: There is no distension.     Palpations: Abdomen is soft.     Tenderness: There is no abdominal tenderness.  Musculoskeletal:        General: Normal range of motion.     Cervical back: Normal range of motion and neck supple.  Skin:    General: Skin is warm and dry.     Capillary Refill: Capillary refill takes less than 2 seconds.  Neurological:     Mental Status: She is alert and oriented to person, place, and time.  Psychiatric:        Mood and Affect: Mood and affect normal.        Speech: Speech normal.        Behavior: Behavior normal.     ED Results / Procedures / Treatments    Labs (all labs ordered are listed, but only abnormal results are displayed) Labs Reviewed  COMPREHENSIVE METABOLIC PANEL - Abnormal; Notable for the following components:      Result Value   Sodium 134 (*)    Chloride 97 (*)  Glucose, Bld 115 (*)    All other components within normal limits  BRAIN NATRIURETIC PEPTIDE - Abnormal; Notable for the following components:   B Natriuretic Peptide 136.1 (*)    All other components within normal limits  RESP PANEL BY RT-PCR (FLU A&B, COVID) ARPGX2  CBC WITH DIFFERENTIAL/PLATELET    EKG None  Radiology DG Chest Portable 1 View  Result Date: 11/12/2020 CLINICAL DATA:  Shortness of breath.  Cough. EXAM: PORTABLE CHEST 1 VIEW COMPARISON:  Chest x-ray dated 04/04/2020 FINDINGS: Heart size and mediastinal contours are stable. Lungs are hyperexpanded. Chronic bronchitic changes centrally. Lungs otherwise clear. No pleural effusion or pneumothorax is seen. Osseous structures about the chest are unremarkable. IMPRESSION: 1. No active disease. No evidence of pneumonia or pulmonary edema. 2. Hyperexpanded lungs indicating COPD. Associated chronic bronchitic changes centrally. Electronically Signed   By: Franki Cabot M.D.   On: 11/12/2020 10:51    Procedures Procedures   Medications Ordered in ED Medications  albuterol (VENTOLIN HFA) 108 (90 Base) MCG/ACT inhaler 2 puff (2 puffs Inhalation Given 11/12/20 1126)  predniSONE (DELTASONE) tablet 60 mg (60 mg Oral Given 11/12/20 1110)    ED Course  I have reviewed the triage vital signs and the nursing notes.  Pertinent labs & imaging results that were available during my care of the patient were reviewed by me and considered in my medical decision making (see chart for details).    MDM Rules/Calculators/A&P                           Patient presented for evaluation of shortness of breath and cough.  On exam, patient peers nontoxic.  Pulm exam is overall reassuring.  However in the setting of  worsening shortness of breath and cough, obtain x-ray to rule out pneumonia.  Consider viral illness such as COVID or flu.  Consider bronchitis.  Cxr viewed and independently interpreted by me, no pna, pnx, effusion. There is hyperinflation, ?copd. Discussed with pt and importance of pcp f/u.  Patient able to ambulate without sats dropping below 92% on room air.  She was short of breath, however no hypoxia.  Labs interpreted by me, overall reassuring.  COVID and flu are negative.  BNP is very minimally elevated at 150, however patient without signs of fluid on the x-ray, and no peripheral edema.  Doubt heart failure.  On reevaluation after prednisone and albuterol, patient reports improvement of symptoms, she is feeling less short of breath.  I discussed likely viral cause, consider bronchitis.  Discussed symptomatic management and follow-up with PCP.  At this time, patient appears safe for discharge.  Return precautions given.  Patient states she understands and agrees to plan.  Final Clinical Impression(s) / ED Diagnoses Final diagnoses:  Viral URI  Bronchitis    Rx / DC Orders ED Discharge Orders          Ordered    predniSONE (DELTASONE) 20 MG tablet  Daily        11/12/20 1201    benzonatate (TESSALON) 100 MG capsule  Every 8 hours        11/12/20 1201    promethazine-dextromethorphan (PROMETHAZINE-DM) 6.25-15 MG/5ML syrup  4 times daily PRN        11/12/20 1201             Saleha Kalp, PA-C 11/12/20 1214    Long, Wonda Olds, MD 11/13/20 [phone]

## 2020-11-12 NOTE — ED Notes (Signed)
Evaluated PT on RA- Sp02 ranging from 92%-95% but has intermittent SOB (placed on 2 LPM)- RN aware. Current Sp02 96%, RR 19, BBS diminished, but no distress at this time. PT does have congested, productive cough.

## 2020-11-22 ENCOUNTER — Telehealth: Payer: Self-pay | Admitting: Cardiology

## 2020-11-22 NOTE — Telephone Encounter (Signed)
New Message:     Patient said she saw her primary care doctor and he said she have Afib. He told her to see Dr Stanford Breed asap.  Patient c/o Palpitations:  High priority if patient c/o lightheadedness, shortness of breath, or chest pain  How long have you had palpitations/irregular HR/ Afib? Are you having the symptoms now? She have afib, not in it at this time   Are you currently experiencing lightheadedness, SOB or CP? no  Do you have a history of afib (atrial fibrillation) or irregular heart rhythm?  no  Have you checked your BP or HR? (document readings if available): 120/80    Are you experiencing any other symptoms? no

## 2020-11-22 NOTE — Telephone Encounter (Signed)
Spoke with pt and pt  had appt today with PCP  was dx with bronchitis ,COPD , and afib Per pt PCP wanted pt to see cardiologists Pt is asymptomatic from afib standpoint  B/P today was 120/80 and HR of 72 PCP has started pt on Eliquis  Appt made with Coletta Memos for 12/12/20 10:15 am . Will forward to Dr Stanford Breed for review /cy

## 2020-11-23 NOTE — Telephone Encounter (Signed)
Spoke with pt, aware have requested ECG from her medical doctor. Aware of dr Jacalyn Lefevre recommendations.

## 2020-11-25 NOTE — Telephone Encounter (Signed)
ECG received and placed for dr Stanford Breed to review.

## 2020-11-30 NOTE — Telephone Encounter (Signed)
Spoke with pt, aware ECG reviewed by dr Stanford Breed confirms atrial fib. She is on eliquis and will continue. She will bring all her medications to her follow up appointment 12/12/20.

## 2020-12-09 NOTE — H&P (View-Only) (Signed)
Cardiology Clinic Note   Patient Name: Anita Donovan Date of Encounter: 12/12/2020  Primary Care Provider:  Bernerd Limbo, MD Primary Cardiologist:  Kirk Ruths, MD  Patient Profile    Anita Donovan 70 year old female presents the clinic today for an evaluation of her irregular heartbeat.  Past Medical History    Past Medical History:  Diagnosis Date   Allergy    Arthritis    knee right   Multiple thyroid nodules    Osteoporosis    SVT (supraventricular tachycardia) ([facility])    Past Surgical History:  Procedure Laterality Date   COLONOSCOPY     LEFT HEART CATH AND CORONARY ANGIOGRAPHY N/A 04/05/2020   Procedure: LEFT HEART CATH AND CORONARY ANGIOGRAPHY;  Surgeon: Belva Crome, MD;  Location: [facility];  Service: Cardiovascular;  Laterality: N/A;   POLYPECTOMY     TONSILLECTOMY      Allergies  No Known Allergies  History of Present Illness    Anita Donovan has a PMH of palpitations, SVT, atrial fibrillation.  She was a patient of Dr. Rollene Fare.  She presented to the emergency department 6/11 for an evaluation of palpitations.  It was felt that she may be having SVT.  She had a abdominal ultrasound 3/16 which showed no aneurysm.  She was admitted 1/22 with mildly elevated troponins and chest pain.  Her echocardiogram showed normal LV function.  She underwent cardiac catheterization/22 which showed tortuous coronary anatomy but no coronary disease.  It was felt that she may be having chest discomfort secondary to vasospasm.  She was seen by Dr. Stanford Breed on 11/07/2020.  During that time she reported that her husband passed away 6 weeks prior.  She continued to drink.  She denies increased dyspnea and exertional chest discomfort.  She reported an episode of palpitations that resolved with metoprolol.  She followed up with her PCP and received a diagnosis of bronchitis.  Her EKG at that was sent to Dr. Emergency room to verify atrial fibrillation.  Her apixaban was  continued.  She presents to the clinic today for follow up evaluation and states she feels well.  She does not notice palpitations.  She does notice an occasional brief flutter.  She denies use of her as needed metoprolol.  She has been taking her apixaban twice daily since 11/23/2020.  We reviewed the options of rate control and anticoagulation and the option of direct-current cardioversion.  We shared decision making to go ahead and proceed with DCCV.  She reports that she had a episode of bronchitis, presented to her PCP, and was diagnosed with atrial fibrillation along with bronchitis at that time.  I will give her the triggers for atrial fibrillation.  I will have her follow-up after her procedure.  Today she denies chest pain, shortness of breath, lower extremity edema, fatigue, palpitations, melena, hematuria, hemoptysis, diaphoresis, weakness, presyncope, syncope, orthopnea, and PND.   Home Medications    Prior to Admission medications   Medication Sig Start Date End Date Taking? Authorizing Provider  alendronate (FOSAMAX) 70 MG tablet Take 1 tablet by mouth once a week. 04/20/16   [provider]  aspirin 81 MG tablet Take 81 mg by mouth daily.    [provider]  benzonatate (TESSALON) 100 MG capsule Take 1 capsule (100 mg total) by mouth every 8 (eight) hours. 11/12/20   Caccavale, Sophia, PA-C  Calcium Carbonate-Vitamin D (CALTRATE 600+D PO) Take 1 capsule by mouth daily.    [provider]  carvedilol (COREG) 6.25 MG tablet TAKE 1 TABLET(6.25 MG) BY MOUTH TWICE DAILY 09/29/20   Lelon Perla, MD  Coenzyme Q10 (COQ10 PO) Take 10 mLs by mouth daily.    [provider]  levothyroxine (SYNTHROID) 50 MCG tablet Take 50 mcg by mouth daily before breakfast.    [provider]  metoprolol succinate (TOPROL-XL) 25 MG 24 hr tablet Take 1 tablet (25 mg total) by mouth as needed (for palpitations). 04/05/20   Kroeger, Lorelee Cover., PA-C  Milk Thistle 200 MG  CAPS Take 1 capsule by mouth 2 (two) times daily.    [provider]  Multiple Vitamins-Minerals (CENTRUM SILVER ADULT 50+ PO) Take 1 tablet by mouth daily.    [provider]  nitroGLYCERIN (NITROSTAT) 0.4 MG SL tablet Place 1 tablet (0.4 mg total) under the tongue every 5 (five) minutes as needed for chest pain. 04/05/20 04/05/21  Kroeger, Lorelee Cover., PA-C  Omega-3 Fatty Acids (FISH OIL) 1200 MG CAPS Take 1 capsule by mouth daily.    [provider]  promethazine-dextromethorphan (PROMETHAZINE-DM) 6.25-15 MG/5ML syrup Take 5 mLs by mouth 4 (four) times daily as needed for cough. 11/12/20   Caccavale, Sophia, PA-C    Family History    Family History  Problem Relation Age of Onset   Heart disease Mother        Stents   Colon cancer Neg Hx    Colon polyps Neg Hx    Esophageal cancer Neg Hx    Rectal cancer Neg Hx    Stomach cancer Neg Hx    She indicated that her mother is deceased. She indicated that her father is deceased. She indicated that the status of her neg hx is unknown.  Social History    Social History   Socioeconomic History   Marital status: Widowed    Spouse name: Not on file   Number of children: Not on file   Years of education: Not on file   Highest education level: Not on file  Occupational History   Not on file  Tobacco Use   Smoking status: Former    Types: Cigarettes    Quit date: 04/08/2017    Years since quitting: 3.6   Smokeless tobacco: Never  Substance and Sexual Activity   Alcohol use: Yes    Alcohol/week: 0.0 standard drinks    Comment: 2 glasses per night   Drug use: Never   Sexual activity: Not on file  Other Topics Concern   Not on file  Social History Narrative   Not on file   Social Determinants of Health   Financial Resource Strain: Not on file  Food Insecurity: Not on file  Transportation Needs: Not on file  Physical Activity: Not on file  Stress: Not on file  Social Connections: Not on file  Intimate  Partner Violence: Not on file     Review of Systems    General:  No chills, fever, night sweats or weight changes.  Cardiovascular:  No chest pain, dyspnea on exertion, edema, orthopnea, palpitations, paroxysmal nocturnal dyspnea. Dermatological: No rash, lesions/masses Respiratory: No cough, dyspnea Urologic: No hematuria, dysuria Abdominal:   No nausea, vomiting, diarrhea, bright red blood per rectum, melena, or hematemesis Neurologic:  No visual changes, wkns, changes in mental status. All other systems reviewed and are otherwise negative except as noted above.  Physical Exam    VS:  BP 108/74 (BP Location: Left Arm)   Pulse 87   Ht '5\' 7"'$  (1.702  m)   Wt 104 lb (47.2 kg)   SpO2 99%   BMI 16.29 kg/m  , BMI Body mass index is 16.29 kg/m. GEN: Well nourished, well developed, in no acute distress. HEENT: normal. Neck: Supple, no JVD, carotid bruits, or masses. Cardiac: Irregularly irregular, no murmurs, rubs, or gallops. No clubbing, cyanosis, edema.  Radials/DP/PT 2+ and equal bilaterally.  Respiratory:  Respirations regular and unlabored, clear to auscultation bilaterally. GI: Soft, nontender, nondistended, BS + x 4. MS: no deformity or atrophy. Skin: warm and dry, no rash. Neuro:  Strength and sensation are intact. Psych: Normal affect.  Accessory Clinical Findings    Recent Labs: 05/02/2020: TSH 6.180 11/12/2020: ALT 17; B Natriuretic Peptide 136.1; BUN 15; Creatinine, Ser 0.68; Hemoglobin 14.3; Platelets 250; Potassium 4.2; Sodium 134   Recent Lipid Panel    Component Value Date/Time   CHOL 161 04/05/2020 1210   TRIG 51 04/05/2020 1210   HDL 66 04/05/2020 1210   CHOLHDL 2.4 04/05/2020 1210   VLDL 10 04/05/2020 1210   LDLCALC 85 04/05/2020 1210    ECG personally reviewed by me today-atrial fibrillation 87 bpm  Echo 04/05/20  IMPRESSIONS     1. Left ventricular ejection fraction, by estimation, is 60 to 65%. The  left ventricle has normal function. The left  ventricle has no regional  wall motion abnormalities. Left ventricular diastolic parameters are  indeterminate.   2. Right ventricular systolic function is normal. The right ventricular  size is normal. There is normal pulmonary artery systolic pressure. The  estimated right ventricular systolic pressure is 123XX123 mmHg.   3. The mitral valve is grossly normal. Trivial mitral valve  regurgitation. No evidence of mitral stenosis.   4. The aortic valve is tricuspid. Aortic valve regurgitation is not  visualized. No aortic stenosis is present.   5. The inferior vena cava is normal in size with greater than 50%  respiratory variability, suggesting right atrial pressure of 3 mmHg.  Assessment & Plan   1.  Paroxysmal Atrial Fibrillation- EKG today shows atrial fibrillation septal infarct undetermined age 48 bpm patient cardiac unaware. She reports compliance with Eliquis and denies bleeding issues.  Continue Eliquis, carvedilol Heart healthy low-sodium diet-salty 6 given Increase physical activity as tolerated Avoid triggers caffeine, chocolate, ETOH, dehydration, etc.  Stop metoprolol Schedule DCCV Case discussed with Doc of The day.  Shared Decision Making/Informed Consent The risks (stroke, cardiac arrhythmias rarely resulting in the need for a temporary or permanent pacemaker, skin irritation or burns and complications associated with conscious sedation including aspiration, arrhythmia, respiratory failure and death), benefits (restoration of normal sinus rhythm) and alternatives of a direct current cardioversion were explained in detail to Ms. Pleger and she agrees to proceed.    Hx of NSTEMI- LHC showed no coronary disease and tortious coronary anatomy. It was felt that her symptoms were related to possible vasospasm.  Continue aspirin, apixaban, carvedilol Heart healthy low-sodium diet-salty 6 given Increase physical activity as tolerated  Questionable HX for SVT- Denies episodes of  accelerated heart beat. Remains cardiac unaware.  Continue metoprolol Heart healthy low-sodium diet-salty 6 given Increase physical activity as tolerated  Disposition: Follow up with Dr. Stanford Breed or me after cardioversion  Jossie Ng. Reed Eifert NP-C    12/12/2020, 11:11 AM [address] Office [phone] Fax [phone]  Notice: This dictation was prepared with Dragon dictation along with smaller phrase technology. Any transcriptional errors that result from this process are unintentional and may  not be corrected upon review.  I spent 15 minutes examining this patient, reviewing medications, and using patient centered shared decision making involving her cardiac care.  Prior to her visit I spent greater than 20 minutes reviewing her past medical history,  medications, and prior cardiac tests.

## 2020-12-09 NOTE — Progress Notes (Signed)
Cardiology Clinic Note   Patient Name: Anita Donovan Date of Encounter: 12/12/2020  Primary Care Provider:  Bernerd Limbo, MD Primary Cardiologist:  Kirk Ruths, MD  Patient Profile    Anita Donovan 70 year old female presents the clinic today for an evaluation of her irregular heartbeat.  Past Medical History    Past Medical History:  Diagnosis Date   Allergy    Arthritis    knee right   Multiple thyroid nodules    Osteoporosis    SVT (supraventricular tachycardia) (HCC)    Past Surgical History:  Procedure Laterality Date   COLONOSCOPY     LEFT HEART CATH AND CORONARY ANGIOGRAPHY N/A 04/05/2020   Procedure: LEFT HEART CATH AND CORONARY ANGIOGRAPHY;  Surgeon: Belva Crome, MD;  Location: [facility];  Service: Cardiovascular;  Laterality: N/A;   POLYPECTOMY     TONSILLECTOMY      Allergies  No Known Allergies  History of Present Illness    Anita Donovan has a PMH of palpitations, SVT, atrial fibrillation.  She was a patient of Dr. Rollene Fare.  She presented to the emergency department 6/11 for an evaluation of palpitations.  It was felt that she may be having SVT.  She had a abdominal ultrasound 3/16 which showed no aneurysm.  She was admitted 1/22 with mildly elevated troponins and chest pain.  Her echocardiogram showed normal LV function.  She underwent cardiac catheterization/22 which showed tortuous coronary anatomy but no coronary disease.  It was felt that she may be having chest discomfort secondary to vasospasm.  She was seen by Dr. Stanford Breed on 11/07/2020.  During that time she reported that her husband passed away 6 weeks prior.  She continued to drink.  She denies increased dyspnea and exertional chest discomfort.  She reported an episode of palpitations that resolved with metoprolol.  She followed up with her PCP and received a diagnosis of bronchitis.  Her EKG at that was sent to Dr. Emergency room to verify atrial fibrillation.  Her apixaban was  continued.  She presents to the clinic today for follow up evaluation and states she feels well.  She does not notice palpitations.  She does notice an occasional brief flutter.  She denies use of her as needed metoprolol.  She has been taking her apixaban twice daily since 11/23/2020.  We reviewed the options of rate control and anticoagulation and the option of direct-current cardioversion.  We shared decision making to go ahead and proceed with DCCV.  She reports that she had a episode of bronchitis, presented to her PCP, and was diagnosed with atrial fibrillation along with bronchitis at that time.  I will give her the triggers for atrial fibrillation.  I will have her follow-up after her procedure.  Today she denies chest pain, shortness of breath, lower extremity edema, fatigue, palpitations, melena, hematuria, hemoptysis, diaphoresis, weakness, presyncope, syncope, orthopnea, and PND.   Home Medications    Prior to Admission medications   Medication Sig Start Date End Date Taking? Authorizing Provider  alendronate (FOSAMAX) 70 MG tablet Take 1 tablet by mouth once a week. 04/20/16   [provider]  aspirin 81 MG tablet Take 81 mg by mouth daily.    [provider]  benzonatate (TESSALON) 100 MG capsule Take 1 capsule (100 mg total) by mouth every 8 (eight) hours. 11/12/20   Caccavale, Sophia, PA-C  Calcium Carbonate-Vitamin D (CALTRATE 600+D PO) Take 1 capsule by mouth daily.    [provider]  carvedilol (COREG) 6.25 MG tablet TAKE 1 TABLET(6.25 MG) BY MOUTH TWICE DAILY 09/29/20   Lelon Perla, MD  Coenzyme Q10 (COQ10 PO) Take 10 mLs by mouth daily.    [provider]  levothyroxine (SYNTHROID) 50 MCG tablet Take 50 mcg by mouth daily before breakfast.    [provider]  metoprolol succinate (TOPROL-XL) 25 MG 24 hr tablet Take 1 tablet (25 mg total) by mouth as needed (for palpitations). 04/05/20   Kroeger, Lorelee Cover., PA-C  Milk Thistle 200 MG  CAPS Take 1 capsule by mouth 2 (two) times daily.    [provider]  Multiple Vitamins-Minerals (CENTRUM SILVER ADULT 50+ PO) Take 1 tablet by mouth daily.    [provider]  nitroGLYCERIN (NITROSTAT) 0.4 MG SL tablet Place 1 tablet (0.4 mg total) under the tongue every 5 (five) minutes as needed for chest pain. 04/05/20 04/05/21  Kroeger, Lorelee Cover., PA-C  Omega-3 Fatty Acids (FISH OIL) 1200 MG CAPS Take 1 capsule by mouth daily.    [provider]  promethazine-dextromethorphan (PROMETHAZINE-DM) 6.25-15 MG/5ML syrup Take 5 mLs by mouth 4 (four) times daily as needed for cough. 11/12/20   Caccavale, Sophia, PA-C    Family History    Family History  Problem Relation Age of Onset   Heart disease Mother        Stents   Colon cancer Neg Hx    Colon polyps Neg Hx    Esophageal cancer Neg Hx    Rectal cancer Neg Hx    Stomach cancer Neg Hx    She indicated that her mother is deceased. She indicated that her father is deceased. She indicated that the status of her neg hx is unknown.  Social History    Social History   Socioeconomic History   Marital status: Widowed    Spouse name: Not on file   Number of children: Not on file   Years of education: Not on file   Highest education level: Not on file  Occupational History   Not on file  Tobacco Use   Smoking status: Former    Types: Cigarettes    Quit date: 04/08/2017    Years since quitting: 3.6   Smokeless tobacco: Never  Substance and Sexual Activity   Alcohol use: Yes    Alcohol/week: 0.0 standard drinks    Comment: 2 glasses per night   Drug use: Never   Sexual activity: Not on file  Other Topics Concern   Not on file  Social History Narrative   Not on file   Social Determinants of Health   Financial Resource Strain: Not on file  Food Insecurity: Not on file  Transportation Needs: Not on file  Physical Activity: Not on file  Stress: Not on file  Social Connections: Not on file  Intimate  Partner Violence: Not on file     Review of Systems    General:  No chills, fever, night sweats or weight changes.  Cardiovascular:  No chest pain, dyspnea on exertion, edema, orthopnea, palpitations, paroxysmal nocturnal dyspnea. Dermatological: No rash, lesions/masses Respiratory: No cough, dyspnea Urologic: No hematuria, dysuria Abdominal:   No nausea, vomiting, diarrhea, bright red blood per rectum, melena, or hematemesis Neurologic:  No visual changes, wkns, changes in mental status. All other systems reviewed and are otherwise negative except as noted above.  Physical Exam    VS:  BP 108/74 (BP Location: Left Arm)   Pulse 87   Ht '5\' 7"'$  (1.702  m)   Wt 104 lb (47.2 kg)   SpO2 99%   BMI 16.29 kg/m  , BMI Body mass index is 16.29 kg/m. GEN: Well nourished, well developed, in no acute distress. HEENT: normal. Neck: Supple, no JVD, carotid bruits, or masses. Cardiac: Irregularly irregular, no murmurs, rubs, or gallops. No clubbing, cyanosis, edema.  Radials/DP/PT 2+ and equal bilaterally.  Respiratory:  Respirations regular and unlabored, clear to auscultation bilaterally. GI: Soft, nontender, nondistended, BS + x 4. MS: no deformity or atrophy. Skin: warm and dry, no rash. Neuro:  Strength and sensation are intact. Psych: Normal affect.  Accessory Clinical Findings    Recent Labs: 05/02/2020: TSH 6.180 11/12/2020: ALT 17; B Natriuretic Peptide 136.1; BUN 15; Creatinine, Ser 0.68; Hemoglobin 14.3; Platelets 250; Potassium 4.2; Sodium 134   Recent Lipid Panel    Component Value Date/Time   CHOL 161 04/05/2020 1210   TRIG 51 04/05/2020 1210   HDL 66 04/05/2020 1210   CHOLHDL 2.4 04/05/2020 1210   VLDL 10 04/05/2020 1210   LDLCALC 85 04/05/2020 1210    ECG personally reviewed by me today-atrial fibrillation 87 bpm  Echo 04/05/20  IMPRESSIONS     1. Left ventricular ejection fraction, by estimation, is 60 to 65%. The  left ventricle has normal function. The left  ventricle has no regional  wall motion abnormalities. Left ventricular diastolic parameters are  indeterminate.   2. Right ventricular systolic function is normal. The right ventricular  size is normal. There is normal pulmonary artery systolic pressure. The  estimated right ventricular systolic pressure is 123XX123 mmHg.   3. The mitral valve is grossly normal. Trivial mitral valve  regurgitation. No evidence of mitral stenosis.   4. The aortic valve is tricuspid. Aortic valve regurgitation is not  visualized. No aortic stenosis is present.   5. The inferior vena cava is normal in size with greater than 50%  respiratory variability, suggesting right atrial pressure of 3 mmHg.  Assessment & Plan   1.  Paroxysmal Atrial Fibrillation- EKG today shows atrial fibrillation septal infarct undetermined age 19 bpm patient cardiac unaware. She reports compliance with Eliquis and denies bleeding issues.  Continue Eliquis, carvedilol Heart healthy low-sodium diet-salty 6 given Increase physical activity as tolerated Avoid triggers caffeine, chocolate, ETOH, dehydration, etc.  Stop metoprolol Schedule DCCV Case discussed with Doc of The day.  Shared Decision Making/Informed Consent The risks (stroke, cardiac arrhythmias rarely resulting in the need for a temporary or permanent pacemaker, skin irritation or burns and complications associated with conscious sedation including aspiration, arrhythmia, respiratory failure and death), benefits (restoration of normal sinus rhythm) and alternatives of a direct current cardioversion were explained in detail to Anita Donovan and she agrees to proceed.    Hx of NSTEMI- LHC showed no coronary disease and tortious coronary anatomy. It was felt that her symptoms were related to possible vasospasm.  Continue aspirin, apixaban, carvedilol Heart healthy low-sodium diet-salty 6 given Increase physical activity as tolerated  Questionable HX for SVT- Denies episodes of  accelerated heart beat. Remains cardiac unaware.  Continue metoprolol Heart healthy low-sodium diet-salty 6 given Increase physical activity as tolerated  Disposition: Follow up with Dr. Stanford Breed or me after cardioversion  Jossie Ng. Anita Keil NP-C    12/12/2020, 11:11 AM [place] [address] Office [phone] Fax [phone]  Notice: This dictation was prepared with Dragon dictation along with smaller phrase technology. Any transcriptional errors that result from this process are unintentional and may  not be corrected upon review.  I spent 15 minutes examining this patient, reviewing medications, and using patient centered shared decision making involving her cardiac care.  Prior to her visit I spent greater than 20 minutes reviewing her past medical history,  medications, and prior cardiac tests.

## 2020-12-12 ENCOUNTER — Ambulatory Visit: Payer: Medicare HMO | Admitting: General Practice

## 2020-12-12 ENCOUNTER — Encounter: Payer: Self-pay | Admitting: General Practice

## 2020-12-12 ENCOUNTER — Other Ambulatory Visit: Payer: Self-pay

## 2020-12-12 VITALS — BP 108/74 | HR 87 | Ht 67.0 in | Wt 104.0 lb

## 2020-12-12 DIAGNOSIS — I471 Supraventricular tachycardia: Secondary | ICD-10-CM | POA: Diagnosis not present

## 2020-12-12 DIAGNOSIS — Z79899 Other long term (current) drug therapy: Secondary | ICD-10-CM | POA: Diagnosis not present

## 2020-12-12 DIAGNOSIS — I214 Non-ST elevation (NSTEMI) myocardial infarction: Secondary | ICD-10-CM | POA: Diagnosis not present

## 2020-12-12 DIAGNOSIS — I480 Paroxysmal atrial fibrillation: Secondary | ICD-10-CM | POA: Diagnosis not present

## 2020-12-12 LAB — BASIC METABOLIC PANEL
BUN/Creatinine Ratio: 25 (ref 12–28)
BUN: 17 mg/dL (ref 8–27)
CO2: 27 mmol/L (ref 20–29)
Calcium: 9.7 mg/dL (ref 8.7–10.3)
Chloride: 99 mmol/L (ref 96–106)
Creatinine, Ser: 0.68 mg/dL (ref 0.57–1.00)
Glucose: 66 mg/dL (ref 65–99)
Potassium: 4.9 mmol/L (ref 3.5–5.2)
Sodium: 135 mmol/L (ref 134–144)
eGFR: 93 mL/min/{1.73_m2} (ref >59)

## 2020-12-12 LAB — CBC
Hematocrit: 39.3 % (ref 34.0–46.6)
Hemoglobin: 13.1 g/dL (ref 11.1–15.9)
MCH: 32.1 pg (ref 26.6–33.0)
MCHC: 33.3 g/dL (ref 31.5–35.7)
MCV: 96 fL (ref 79–97)
Platelets: 244 10*3/uL (ref 150–450)
RBC: 4.08 x10E6/uL (ref 3.77–5.28)
RDW: 11.3 % — ABNORMAL LOW (ref 11.7–15.4)
WBC: 5.6 10*3/uL (ref 3.4–10.8)

## 2020-12-12 MED ORDER — CARVEDILOL 6.25 MG PO TABS: ORAL_TABLET | ORAL | 1 refills | Status: DC

## 2020-12-12 MED ORDER — APIXABAN 5 MG PO TABS: 5.0000 mg | ORAL_TABLET | Freq: Two times a day (BID) | ORAL | 0 refills | Status: DC

## 2020-12-12 NOTE — Addendum Note (Signed)
Addended by: Deberah Pelton on: 12/12/2020 11:40 AM   Modules accepted: Level of Service

## 2020-12-12 NOTE — Patient Instructions (Addendum)
Medication Instructions:  STOP METOPROLOL  CONTINUE CARVIDELOL   *If you need a refill on your cardiac medications before your next appointment, please call your pharmacy*  Lab Work:   Testing/Procedures:  CBC AND BMET  CARDIOVERSION-SEE ATTACHED  Special Instructions Please try to avoid these triggers: Do not use any products that have nicotine or tobacco in them. These include cigarettes, e-cigarettes, and chewing tobacco. If you need help quitting, ask your doctor. Eat heart-healthy foods. Talk with your doctor about the right eating plan for you. Exercise regularly as told by your doctor. Stay hydrated Do not drink alcohol, Caffeine or chocolate. Lose weight if you are overweight. Do not use drugs, including cannabis   Follow-Up: Your next appointment:  12-28-2020 at [facility]  In Person with Kirk Ruths, MD   At [facility], you and your health needs are our priority.  As part of our continuing mission to provide you with exceptional heart care, we have created designated Provider Care Teams.  These Care Teams include your primary Cardiologist (physician) and Advanced Practice Providers (APPs -  Physician Assistants and Nurse Practitioners) who all work together to provide you with the care you need, when you need it.     Dear Anita Donovan. Anita Donovan are scheduled for a Cardioversion on Tuesday 12-13-2020 with Dr. Oval Linsey.  Please arrive at the [facility] (Main Entrance A) at [facility]: [address] [place], [place] [address] at 7AM am/pm. (1 hour prior to procedure unless lab work is needed; if lab work is needed arrive 1.5 hours ahead)  DIET: Nothing to eat or drink after midnight except a sip of water with medications (see medication instructions below)  FYI: For your safety, and to allow [place] to monitor your vital signs accurately during the surgery/procedure we request that   if you have artificial nails, gel coating, SNS etc. Please have those removed prior to  your surgery/procedure. Not having the nail coverings /polish removed may result in cancellation or delay of your surgery/procedure.   Medication Instructions:  Continue your anticoagulant: APIXABAN  You will need to continue your anticoagulant after your procedure until you  are told by your Provider that it is safe to stop   Labs: If patient is on Coumadin, patient needs pt/INR, CBC, BMET within 3 days (No pt/INR needed for patients taking Xarelto, Eliquis, Pradaxa) For patients receiving anesthesia for TEE and all Cardioversion patients: BMET, CBC within 1 week  Come to: [place] must have a responsible person to drive you home and stay in the waiting area during your procedure. Failure to do so could result in cancellation.  Bring your insurance cards.  *Special Note: Every effort is made to have your procedure done on time. Occasionally there are emergencies that occur at the hospital that may cause delays. Please be patient if a delay does occur.

## 2020-12-19 NOTE — Anesthesia Preprocedure Evaluation (Addendum)
Anesthesia Evaluation  Patient identified by MRN, date of birth, ID band Patient awake    Reviewed: Allergy & Precautions, H&P , NPO status , Patient's Chart, lab work & pertinent test results  Airway Mallampati: I  TM Distance: >3 FB Neck ROM: Full    Dental no notable dental hx. (+) Teeth Intact, Dental Advisory Given, Caps   Pulmonary former smoker,    Pulmonary exam normal breath sounds clear to auscultation       Cardiovascular Exercise Tolerance: Good + Past MI  Normal cardiovascular exam+ dysrhythmias Supra Ventricular Tachycardia  Rhythm:Regular Rate:Normal  Echo 04/05/20 1. Left ventricular ejection fraction, by estimation, is 60 to 65%. The  left ventricle has normal function. The left ventricle has no regional  wall motion abnormalities. Left ventricular diastolic parameters are  indeterminate.  2. Right ventricular systolic function is normal. The right ventricular  size is normal. There is normal pulmonary artery systolic pressure. The  estimated right ventricular systolic pressure is 123XX123 mmHg.  3. The mitral valve is grossly normal. Trivial mitral valve  regurgitation. No evidence of mitral stenosis.  4. The aortic valve is tricuspid. Aortic valve regurgitation is not  visualized. No aortic stenosis is present.  5. The inferior vena cava is normal in size with greater than 50%  respiratory variability, suggesting right atrial pressure of 3 mmHg.   Neuro/Psych negative neurological ROS  negative psych ROS   GI/Hepatic negative GI ROS, Neg liver ROS,   Endo/Other  negative endocrine ROS  Renal/GU negative Renal ROS  negative genitourinary   Musculoskeletal  (+) Arthritis , Osteoarthritis,    Abdominal   Peds negative pediatric ROS (+)  Hematology negative hematology ROS (+)   Anesthesia Other Findings   Reproductive/Obstetrics negative OB ROS                             Anesthesia Physical Anesthesia Plan  ASA: 3  Anesthesia Plan: General   Post-op Pain Management:    Induction:   PONV Risk Score and Plan:   Airway Management Planned: Nasal Cannula, Natural Airway and Simple Face Mask  Additional Equipment: None  Intra-op Plan:   Post-operative Plan:   Informed Consent: I have reviewed the patients History and Physical, chart, labs and discussed the procedure including the risks, benefits and alternatives for the proposed anesthesia with the patient or authorized representative who has indicated his/her understanding and acceptance.       Plan Discussed with: Anesthesiologist and CRNA  Anesthesia Plan Comments:        Anesthesia Quick Evaluation

## 2020-12-20 ENCOUNTER — Other Ambulatory Visit: Payer: Self-pay

## 2020-12-20 ENCOUNTER — Ambulatory Visit (HOSPITAL_COMMUNITY): Payer: Medicare HMO | Admitting: Anesthesiology

## 2020-12-20 ENCOUNTER — Encounter (HOSPITAL_COMMUNITY)
Admission: RE | Disposition: A | Discharge status: Home / Self Care | Payer: Self-pay | Source: Home / Self Care | Attending: Cardiovascular Disease

## 2020-12-20 ENCOUNTER — Encounter (HOSPITAL_COMMUNITY): Payer: Self-pay | Admitting: Cardiovascular Disease

## 2020-12-20 ENCOUNTER — Ambulatory Visit (HOSPITAL_COMMUNITY)
Admission: RE | Admit: 2020-12-20 | Discharge: 2020-12-20 | Disposition: A | Discharge status: Home / Self Care | Payer: Medicare HMO | Attending: Cardiovascular Disease | Admitting: Cardiovascular Disease

## 2020-12-20 DIAGNOSIS — Z87891 Personal history of nicotine dependence: Secondary | ICD-10-CM | POA: Diagnosis not present

## 2020-12-20 DIAGNOSIS — I480 Paroxysmal atrial fibrillation: Secondary | ICD-10-CM | POA: Diagnosis present

## 2020-12-20 DIAGNOSIS — I4819 Other persistent atrial fibrillation: Secondary | ICD-10-CM | POA: Diagnosis not present

## 2020-12-20 DIAGNOSIS — Z79890 Hormone replacement therapy (postmenopausal): Secondary | ICD-10-CM | POA: Diagnosis not present

## 2020-12-20 DIAGNOSIS — Z7982 Long term (current) use of aspirin: Secondary | ICD-10-CM | POA: Insufficient documentation

## 2020-12-20 DIAGNOSIS — I252 Old myocardial infarction: Secondary | ICD-10-CM | POA: Diagnosis not present

## 2020-12-20 DIAGNOSIS — Z7901 Long term (current) use of anticoagulants: Secondary | ICD-10-CM | POA: Diagnosis not present

## 2020-12-20 DIAGNOSIS — Z79899 Other long term (current) drug therapy: Secondary | ICD-10-CM | POA: Diagnosis not present

## 2020-12-20 HISTORY — DX: Other persistent atrial fibrillation: I48.19

## 2020-12-20 HISTORY — PX: CARDIOVERSION: SHX1299

## 2020-12-20 SURGERY — CARDIOVERSION
Anesthesia: General

## 2020-12-20 MED ORDER — SODIUM CHLORIDE 0.9 % IV SOLN: INTRAVENOUS | Status: DC | PRN

## 2020-12-20 MED ORDER — LIDOCAINE 2% (20 MG/ML) 5 ML SYRINGE
INTRAMUSCULAR | Status: DC | PRN
  Administered 2020-12-20: 100 mg via INTRAVENOUS

## 2020-12-20 MED ORDER — PROPOFOL 10 MG/ML IV BOLUS
INTRAVENOUS | Status: DC | PRN
  Administered 2020-12-20: 40 mg via INTRAVENOUS

## 2020-12-20 NOTE — Anesthesia Postprocedure Evaluation (Signed)
Anesthesia Post Note  Patient: Anita Donovan  Procedure(s) Performed: CARDIOVERSION     Patient location during evaluation: PACU Anesthesia Type: General Level of consciousness: awake and alert Pain management: pain level controlled Vital Signs Assessment: post-procedure vital signs reviewed and stable Respiratory status: spontaneous breathing, nonlabored ventilation, respiratory function stable and patient connected to nasal cannula oxygen Cardiovascular status: blood pressure returned to baseline and stable Postop Assessment: no apparent nausea or vomiting Anesthetic complications: no   No notable events documented.  Last Vitals:  Vitals:   12/20/20 0848 12/20/20 0858  BP: 121/77 110/63  Pulse: (!) 42 (!) 35  Resp: 19 14  Temp:    SpO2: 100% 100%    Last Pain:  Vitals:   12/20/20 0858  TempSrc:   PainSc: 0-No pain                 Terek Bee

## 2020-12-20 NOTE — Transfer of Care (Signed)
Immediate Anesthesia Transfer of Care Note  Patient: Anita Donovan  Procedure(s) Performed: CARDIOVERSION  Patient Location: Endoscopy Unit  Anesthesia Type:General  Level of Consciousness: awake, alert  and oriented  Airway & Oxygen Therapy: Patient Spontanous Breathing  Post-op Assessment: Report given to RN and Post -op Vital signs reviewed and stable  Post vital signs: Reviewed and stable  Last Vitals:  Vitals Value Taken Time  BP 107/57   Temp    Pulse 55   Resp 12   SpO2 99     Last Pain:  Vitals:   12/20/20 0735  TempSrc: Temporal  PainSc: 0-No pain         Complications: No notable events documented.

## 2020-12-20 NOTE — Anesthesia Procedure Notes (Signed)
Procedure Name: General with mask airway Date/Time: 12/20/2020 8:20 AM Performed by: Dorthea Cove, CRNA Pre-anesthesia Checklist: Patient identified, Emergency Drugs available, Suction available, Patient being monitored and Timeout performed Patient Re-evaluated:Patient Re-evaluated prior to induction Oxygen Delivery Method: Ambu bag Preoxygenation: Pre-oxygenation with 100% oxygen Induction Type: IV induction Ventilation: Mask ventilation without difficulty Placement Confirmation: positive ETCO2 and CO2 detector Dental Injury: Teeth and Oropharynx as per pre-operative assessment

## 2020-12-20 NOTE — Interval H&P Note (Signed)
History and Physical Interval Note:  12/20/2020 7:52 AM  Anita Donovan  has presented today for surgery, with the diagnosis of AFIB.  The various methods of treatment have been discussed with the patient and family. After consideration of risks, benefits and other options for treatment, the patient has consented to  Procedure(s): CARDIOVERSION (N/A) as a surgical intervention.  The patient's history has been reviewed, patient examined, no change in status, stable for surgery.  I have reviewed the patient's chart and labs.  Questions were answered to the patient's satisfaction.     Skeet Latch, MD

## 2020-12-20 NOTE — CV Procedure (Signed)
Electrical Cardioversion Procedure Note Anita Donovan [id number] [birth year]  Procedure: Electrical Cardioversion Indications:  Atrial Fibrillation  Procedure Details Consent: Risks of procedure as well as the alternatives and risks of each were explained to the (patient/caregiver).  Consent for procedure obtained. Time Out: Verified patient identification, verified procedure, site/side was marked, verified correct patient position, special equipment/implants available, medications/allergies/relevent history reviewed, required imaging and test results available.  Performed  Patient placed on cardiac monitor, pulse oximetry, supplemental oxygen as necessary.  Sedation given:  propofol Pacer pads placed anterior and posterior chest.  Cardioverted 2 time(s).  Cardioverted at 120J unsuccessful. 150J successful.  Evaluation Findings: Post procedure EKG shows: NSR Complications: None Patient did tolerate procedure well.   Skeet Latch, MD 12/20/2020, 8:35 AM

## 2020-12-23 ENCOUNTER — Encounter: Payer: Self-pay | Admitting: Internal Medicine

## 2020-12-23 NOTE — Progress Notes (Signed)
HPI: FU atrial fibrillation. The patient was seen by Dr. Rollene Fare in June 2011 following an episode of palpitations requiring evaluation in the emergency room. There were apparently no rhythm strips available and he felt as though she may be having SVT. Abdominal ultrasound March 2016 showed no aneurysm.  Admitted January 2022 with mildly elevated troponin and chest pain.  Echocardiogram January 2022 showed normal LV function.  Cardiac catheterization January 2022 showed vessel tortuosity but no obstructive coronary disease.  Symptoms felt possibly secondary to vasospasm.  Recently seen in the office with palpitations and found to be in atrial fibrillation.  Patient had successful cardioversion on December 20, 2020.  Since last seen, she denies dyspnea, chest pain, palpitations or syncope.  No bleeding.  Current Outpatient Medications  Medication Sig Dispense Refill   alendronate (FOSAMAX) 70 MG tablet Take 1 tablet by mouth every Monday.  3   apixaban (ELIQUIS) 5 MG TABS tablet Take 1 tablet (5 mg total) by mouth 2 (two) times daily. 60 tablet 0   aspirin 81 MG tablet Take 81 mg by mouth daily.     b complex vitamins capsule Take 1 capsule by mouth daily.     Calcium Carbonate-Vitamin D (CALTRATE 600+D PO) Take 1 capsule by mouth daily.     carvedilol (COREG) 6.25 MG tablet TAKE 1 TABLET(6.25 MG) BY MOUTH TWICE DAILY 180 tablet 1   Coenzyme Q10 (COQ10 PO) Take 10 mLs by mouth daily.     Fluticasone-Umeclidin-Vilant 100-62.5-25 MCG/INH AEPB Inhale 1 puff into the lungs daily.     levothyroxine (SYNTHROID) 50 MCG tablet Take 50 mcg by mouth daily before breakfast.     metoprolol succinate (TOPROL-XL) 25 MG 24 hr tablet Take 25 mg by mouth daily as needed (rapid heart rate).     Milk Thistle 250 MG CAPS Take 250 mg by mouth 2 (two) times daily.     Multiple Vitamins-Minerals (CENTRUM SILVER ADULT 50+ PO) Take 1 tablet by mouth daily.     nitroGLYCERIN (NITROSTAT) 0.4 MG SL tablet Place 1  tablet (0.4 mg total) under the tongue every 5 (five) minutes as needed for chest pain. 25 tablet 3   Omega-3 Fatty Acids (FISH OIL) 1200 MG CAPS Take 1 capsule by mouth daily.     No current facility-administered medications for this visit.     Past Medical History:  Diagnosis Date   Allergy    Arthritis    knee right   Multiple thyroid nodules    Osteoporosis    Persistent atrial fibrillation ([place]) 12/20/2020   SVT (supraventricular tachycardia) (HCC)     Past Surgical History:  Procedure Laterality Date   CARDIOVERSION N/A 12/20/2020   Procedure: CARDIOVERSION;  Surgeon: Skeet Latch, MD;  Location: [facility];  Service: Cardiovascular;  Laterality: N/A;   COLONOSCOPY     LEFT HEART CATH AND CORONARY ANGIOGRAPHY N/A 04/05/2020   Procedure: LEFT HEART CATH AND CORONARY ANGIOGRAPHY;  Surgeon: Belva Crome, MD;  Location: [facility];  Service: Cardiovascular;  Laterality: N/A;   POLYPECTOMY     TONSILLECTOMY      Social History   Socioeconomic History   Marital status: Widowed    Spouse name: Not on file   Number of children: Not on file   Years of education: Not on file   Highest education level: Not on file  Occupational History   Not on file  Tobacco Use   Smoking status: Former    Types:  Cigarettes    Quit date: 04/08/2017    Years since quitting: 3.7   Smokeless tobacco: Never  Substance and Sexual Activity   Alcohol use: Yes    Alcohol/week: 0.0 standard drinks    Comment: 2 glasses per night   Drug use: Never   Sexual activity: Not on file  Other Topics Concern   Not on file  Social History Narrative   Not on file   Social Determinants of Health   Financial Resource Strain: Not on file  Food Insecurity: Not on file  Transportation Needs: Not on file  Physical Activity: Not on file  Stress: Not on file  Social Connections: Not on file  Intimate Partner Violence: Not on file    Family History  Problem Relation Age of Onset   Heart  disease Mother        Stents   Colon cancer Neg Hx    Colon polyps Neg Hx    Esophageal cancer Neg Hx    Rectal cancer Neg Hx    Stomach cancer Neg Hx     ROS: no fevers or chills, productive cough, hemoptysis, dysphasia, odynophagia, melena, hematochezia, dysuria, hematuria, rash, seizure activity, orthopnea, PND, pedal edema, claudication. Remaining systems are negative.  Physical Exam: Well-developed well-nourished in no acute distress.  Skin is warm and dry.  HEENT is normal.  Neck is supple.  Chest is clear to auscultation with normal expansion.  Cardiovascular exam is regular rate and rhythm.  Abdominal exam nontender or distended. No masses palpated. Extremities show no edema. neuro grossly intact  ECG-sinus bradycardia at a rate of 53, no ST changes.  Personally reviewed  A/P  1 paroxysmal atrial fibrillation-patient remains in sinus rhythm today.  Continue apixaban at present dose.  Discontinue aspirin.  Continue carvedilol.  We discussed rhythm control versus rate control.  If she has more frequent episodes in the future and they are symptomatic and we will consider an antiarrhythmic versus ablation.  Otherwise could consider rate control.  2 history of possible non-ST elevation myocardial infarction-catheterization revealed no coronary disease and ejection fraction normal.  Question related to vasospasm.  We will follow for now.  3 history of palpitations-continue beta-blocker.  4 question history of SVT-continue beta-blocker.  Kirk Ruths, MD

## 2020-12-28 ENCOUNTER — Encounter: Payer: Self-pay | Admitting: Cardiology

## 2020-12-28 ENCOUNTER — Other Ambulatory Visit: Payer: Self-pay

## 2020-12-28 ENCOUNTER — Ambulatory Visit: Payer: Medicare HMO | Admitting: Cardiology

## 2020-12-28 VITALS — BP 130/60 | HR 48 | Ht 67.0 in | Wt 105.4 lb

## 2020-12-28 DIAGNOSIS — I471 Supraventricular tachycardia: Secondary | ICD-10-CM | POA: Diagnosis not present

## 2020-12-28 DIAGNOSIS — I480 Paroxysmal atrial fibrillation: Secondary | ICD-10-CM | POA: Diagnosis not present

## 2020-12-28 DIAGNOSIS — R002 Palpitations: Secondary | ICD-10-CM

## 2020-12-28 NOTE — Patient Instructions (Signed)
Medication Instructions:   STOP ASPIRIN  *If you need a refill on your cardiac medications before your next appointment, please call your pharmacy*   Follow-Up: At [facility], you and your health needs are our priority.  As part of our continuing mission to provide you with exceptional heart care, we have created designated Provider Care Teams.  These Care Teams include your primary Cardiologist (physician) and Advanced Practice Providers (APPs -  Physician Assistants and Nurse Practitioners) who all work together to provide you with the care you need, when you need it.  We recommend signing up for the patient portal called "MyChart".  Sign up information is provided on this After Visit Summary.  MyChart is used to connect with patients for Virtual Visits (Telemedicine).  Patients are able to view lab/test results, encounter notes, upcoming appointments, etc.  Non-urgent messages can be sent to your provider as well.   To learn more about what you can do with MyChart, go to [email or web address].    Your next appointment:   4 month(s)  The format for your next appointment:   In Person  Provider:   Kirk Ruths, MD

## 2021-01-19 ENCOUNTER — Telehealth: Payer: Self-pay

## 2021-01-19 ENCOUNTER — Encounter: Payer: Self-pay | Admitting: Physician Assistant

## 2021-01-19 ENCOUNTER — Ambulatory Visit: Payer: Medicare HMO | Admitting: Physician Assistant

## 2021-01-19 VITALS — BP 120/72 | HR 60 | Ht 67.0 in | Wt 110.0 lb

## 2021-01-19 DIAGNOSIS — K635 Polyp of colon: Secondary | ICD-10-CM | POA: Insufficient documentation

## 2021-01-19 DIAGNOSIS — I4819 Other persistent atrial fibrillation: Secondary | ICD-10-CM

## 2021-01-19 DIAGNOSIS — Z86010 Personal history of colonic polyps: Secondary | ICD-10-CM

## 2021-01-19 DIAGNOSIS — Z7901 Long term (current) use of anticoagulants: Secondary | ICD-10-CM | POA: Diagnosis not present

## 2021-01-19 DIAGNOSIS — N951 Menopausal and female climacteric states: Secondary | ICD-10-CM | POA: Insufficient documentation

## 2021-01-19 MED ORDER — SUTAB 1479-225-188 MG PO TABS: 1.0000 | ORAL_TABLET | ORAL | 0 refills | Status: DC

## 2021-01-19 NOTE — Telephone Encounter (Signed)
   Primary Cardiologist: Kirk Ruths, MD  Chart reviewed as part of pre-operative protocol coverage. Given past medical history and time since last visit, based on ACC/AHA guidelines, TERRISA CURFMAN would be at acceptable risk for the planned procedure without further cardiovascular testing.   Patient with diagnosis of A Fib on Eliquis for anticoagulation.     Procedure: colonoscopy Date of procedure: 01/24/21     CHA2DS2-VASc Score = 3  This indicates a 3.2% annual risk of stroke. The patient's score is based upon: CHF History: 0 HTN History: 0 Diabetes History: 0 Stroke History: 0 Vascular Disease History: 1 Age Score: 1 Gender Score: 1    CrCl 70 mL/min Platelet count 244K   Per office protocol, patient can hold Eliquis for 2 days prior to procedure.   I will route this recommendation to the requesting party via Epic fax function and remove from pre-op pool.  Please call with questions.  Jossie Ng. Lotus Gover NP-C    01/19/2021, 11:59 AM [place] [address] Office [phone] Fax [phone]

## 2021-01-19 NOTE — Telephone Encounter (Signed)
Patient with diagnosis of A Fib on Eliquis for anticoagulation.    Procedure: ]colonoscopy Date of procedure: 01/24/21   CHA2DS2-VASc Score = 3  This indicates a 3.2% annual risk of stroke. The patient's score is based upon: CHF History: 0 HTN History: 0 Diabetes History: 0 Stroke History: 0 Vascular Disease History: 1 Age Score: 1 Gender Score: 1   CrCl 70 mL/min Platelet count 244K  Per office protocol, patient can hold Eliquis for 2 days prior to procedure.

## 2021-01-19 NOTE — Telephone Encounter (Signed)
Notified patient of instructions from cardiology to hold her Eliquis 2 days before her procedure and to resume after the procedure. patient expressed understanding and agreement, no further questions at this time.

## 2021-01-19 NOTE — Progress Notes (Signed)
Chief Complaint: History of adenomatous polyp  HPI:    Anita Donovan is a 70 year old female with a past medical history of persistent A. fib and SVT on Eliquis, known to Dr. Hilarie Fredrickson, who was referred to me by Bernerd Limbo, MD for her history of adenomatous polyps.    09/10/2017 colonoscopy with one 4 mm polyp in the cecum, 2 4-5 mm polyps in the ascending colon, 112 mm polyp in the sigmoid colon, 3 4-6 mm polyps in the sigmoid colon and otherwise normal.  Pathology showed tubular adenomas and repeat recommended in 3 years.    12/28/2020 patient followed with her cardiologist Dr. Stanford Breed.  Discussed a cardiac cath on 04/05/2020 as well as echo which were normal.    Today, the patient presents to clinic and tells me that she had a cardioversion in January and has not been in A. fib since but she is still on her Eliquis daily.  Tells me in general she is feeling well but she did lose her husband recently and has been battling with some depression here and there which did change her eating and she had lost some weight.  Most recently she has been telling herself she has to eat 3 meals a day and has been gaining back some of the weight.  Does describe that her whole life anytime she eats she feels very "full".  This has not changed recently.  No abdominal pain, heartburn or reflux.   She has two miniature Aussies at home who are brother and sister.    Denies fever, chills, weight blood in her stool, change in bowel habits or symptoms that awaken her from sleep.  Past Medical History:  Diagnosis Date   Allergy    Arthritis    knee right   Multiple thyroid nodules    Osteoporosis    Persistent atrial fibrillation ([place]) 12/20/2020   SVT (supraventricular tachycardia) (HCC)     Past Surgical History:  Procedure Laterality Date   CARDIOVERSION N/A 12/20/2020   Procedure: CARDIOVERSION;  Surgeon: Skeet Latch, MD;  Location: [facility];  Service: Cardiovascular;  Laterality: N/A;   COLONOSCOPY      LEFT HEART CATH AND CORONARY ANGIOGRAPHY N/A 04/05/2020   Procedure: LEFT HEART CATH AND CORONARY ANGIOGRAPHY;  Surgeon: Belva Crome, MD;  Location: [facility];  Service: Cardiovascular;  Laterality: N/A;   POLYPECTOMY     TONSILLECTOMY      Current Outpatient Medications  Medication Sig Dispense Refill   alendronate (FOSAMAX) 70 MG tablet Take 1 tablet by mouth every Monday.  3   apixaban (ELIQUIS) 5 MG TABS tablet Take 1 tablet (5 mg total) by mouth 2 (two) times daily. 60 tablet 0   b complex vitamins capsule Take 1 capsule by mouth daily.     Calcium Carbonate-Vitamin D (CALTRATE 600+D PO) Take 1 capsule by mouth daily.     carvedilol (COREG) 6.25 MG tablet TAKE 1 TABLET(6.25 MG) BY MOUTH TWICE DAILY 180 tablet 1   Coenzyme Q10 (COQ10 PO) Take 10 mLs by mouth daily.     Fluticasone-Umeclidin-Vilant 100-62.5-25 MCG/INH AEPB Inhale 1 puff into the lungs daily.     levothyroxine (SYNTHROID) 50 MCG tablet Take 50 mcg by mouth daily before breakfast.     metoprolol succinate (TOPROL-XL) 25 MG 24 hr tablet Take 25 mg by mouth daily as needed (rapid heart rate).     Milk Thistle 250 MG CAPS Take 250 mg by mouth 2 (two) times daily.  Multiple Vitamins-Minerals (CENTRUM SILVER ADULT 50+ PO) Take 1 tablet by mouth daily.     nitroGLYCERIN (NITROSTAT) 0.4 MG SL tablet Place 1 tablet (0.4 mg total) under the tongue every 5 (five) minutes as needed for chest pain. 25 tablet 3   Omega-3 Fatty Acids (FISH OIL) 1200 MG CAPS Take 1 capsule by mouth daily.     No current facility-administered medications for this visit.    Allergies as of 01/19/2021   (No Known Allergies)    Family History  Problem Relation Age of Onset   Heart disease Mother        Stents   Colon cancer Neg Hx    Colon polyps Neg Hx    Esophageal cancer Neg Hx    Rectal cancer Neg Hx    Stomach cancer Neg Hx     Social History   Socioeconomic History   Marital status: Widowed    Spouse name: Not on file    Number of children: Not on file   Years of education: Not on file   Highest education level: Not on file  Occupational History   Not on file  Tobacco Use   Smoking status: Former    Types: Cigarettes    Quit date: 04/08/2017    Years since quitting: 3.7   Smokeless tobacco: Never  Substance and Sexual Activity   Alcohol use: Yes    Alcohol/week: 0.0 standard drinks    Comment: 2 glasses per night   Drug use: Never   Sexual activity: Not on file  Other Topics Concern   Not on file  Social History Narrative   Not on file   Social Determinants of Health   Financial Resource Strain: Not on file  Food Insecurity: Not on file  Transportation Needs: Not on file  Physical Activity: Not on file  Stress: Not on file  Social Connections: Not on file  Intimate Partner Violence: Not on file    Review of Systems:    Constitutional: No weight loss, fever or chills Skin: No rash  Cardiovascular: No chest pain Respiratory: No SOB Gastrointestinal: See HPI and otherwise negative Genitourinary: No dysuria  Neurological: No headache, dizziness or syncope Musculoskeletal: No new muscle or joint pain Hematologic: No bleeding  Psychiatric: No history of depression or anxiety   Physical Exam:  Vital signs: BP 120/72   Pulse 60   Ht 5\' 7"  (1.702 m)   Wt 110 lb (49.9 kg)   SpO2 100%   BMI 17.23 kg/m   Constitutional:   Pleasant thin appearing Elderly Caucasian female appears to be in NAD, Well developed, Well nourished, alert and cooperative Head:  Normocephalic and atraumatic. Eyes:   PEERL, EOMI. No icterus. Conjunctiva pink. Ears:  Normal auditory acuity. Neck:  Supple Throat: Oral cavity and pharynx without inflammation, swelling or lesion.  Respiratory: Respirations even and unlabored. Lungs clear to auscultation bilaterally.   No wheezes, crackles, or rhonchi.  Cardiovascular: Normal S1, S2. No MRG. Regular rate and rhythm. No peripheral edema, cyanosis or pallor.   Gastrointestinal:  Soft, nondistended, nontender. No rebound or guarding. Normal bowel sounds. No appreciable masses or hepatomegaly. Rectal:  Not performed.  Msk:  Symmetrical without gross deformities. Without edema, no deformity or joint abnormality.  Neurologic:  Alert and  oriented x4;  grossly normal neurologically.  Skin:   Dry and intact without significant lesions or rashes. Psychiatric: Demonstrates good judgement and reason without abnormal affect or behaviors.Tearful over loss of husband  RELEVANT LABS AND  IMAGING: CBC    Component Value Date/Time   WBC 5.6 12/12/2020 1130   WBC 7.3 11/12/2020 1026   RBC 4.08 12/12/2020 1130   RBC 4.29 11/12/2020 1026   HGB 13.1 12/12/2020 1130   HCT 39.3 12/12/2020 1130   PLT 244 12/12/2020 1130   MCV 96 12/12/2020 1130   MCH 32.1 12/12/2020 1130   MCH 33.3 11/12/2020 1026   MCHC 33.3 12/12/2020 1130   MCHC 34.6 11/12/2020 1026   RDW 11.3 (L) 12/12/2020 1130   LYMPHSABS 0.8 11/12/2020 1026   MONOABS 0.5 11/12/2020 1026   EOSABS 0.1 11/12/2020 1026   BASOSABS 0.0 11/12/2020 1026    CMP     Component Value Date/Time   NA 135 12/12/2020 1130   K 4.9 12/12/2020 1130   CL 99 12/12/2020 1130   CO2 27 12/12/2020 1130   GLUCOSE 66 12/12/2020 1130   GLUCOSE 115 (H) 11/12/2020 1026   BUN 17 12/12/2020 1130   CREATININE 0.68 12/12/2020 1130   CREATININE 0.79 02/12/2014 1521   CALCIUM 9.7 12/12/2020 1130   PROT 7.3 11/12/2020 1026   ALBUMIN 4.0 11/12/2020 1026   AST 24 11/12/2020 1026   ALT 17 11/12/2020 1026   ALKPHOS 75 11/12/2020 1026   BILITOT 0.7 11/12/2020 1026   GFRNONAA >60 11/12/2020 1026   GFRNONAA 79 02/12/2014 1521   GFRAA >89 02/12/2014 1521    Assessment: 1.  History of adenomatous polyps: Last colonoscopy in 2019 with multiple polyps with recommendations to repeat in 3 years 2.  A. fib on Eliquis: Status post cardioversion in January and is remaining in normal sinus rhythm  Plan: 1.  Scheduled patient for  surveillance colonoscopy in the [place] with Dr. Hilarie Fredrickson.  Did provide the patient with a detailed list of risks for the procedure and she agrees to proceed. Patient is appropriate for endoscopic procedure(s) in the ambulatory ([facility]) setting.  2.  Patient was advised to hold her Eliquis for 2 days prior to time of procedure.  We will communicate with Dr. Stanford Breed to ensure this is acceptable for her.  Did discuss that since she is back in sinus rhythm now she would be at decreased risk for stopping this medication. 3.  Discussed fullness, she has felt this way her whole life and nothing has changed recently, she is a very thin lady and likely just feels the food she eats more than others. 4.  Patient to follow in clinic per recommendations of Dr. Hilarie Fredrickson after time of procedure.  Anita Newer, PA-C [facility] 01/19/2021, 10:57 AM  Cc: Bernerd Limbo, MD

## 2021-01-19 NOTE — Patient Instructions (Signed)
If you are age 72 or older, your body mass index should be between 23-30. Your Body mass index is 17.23 kg/m. If this is out of the aforementioned range listed, please consider follow up with your Primary Care Provider.  The [facility] providers would like to encourage you to use [facility] to communicate with providers for non-urgent requests or questions.  Due to long hold times on the telephone, sending your provider a message by [facility] may be faster and more efficient way to get a response. Please allow 48 business hours for a response.  Please remember that this is for non-urgent requests/questions.  PROCEDURES: You have been scheduled for a colonoscopy. Please follow the written instructions given to you at your visit today. Please pick up your prep supplies at the pharmacy within the next 1-3 days. If you use inhalers (even only as needed), please bring them with you on the day of your procedure.  It was great seeing you today! Thank you for entrusting me with your care and choosing [facility].  Ellouise Newer, [place]

## 2021-01-19 NOTE — Telephone Encounter (Signed)
Request for surgical clearance:     Endoscopy Procedure  What type of surgery is being performed?     Colonoscopy  When is this surgery scheduled?     01/24/21  What type of clearance is required ?   Pharmacy  Are there any medications that need to be held prior to surgery and how long? Eliquis 2 day hold  Practice name and name of physician performing surgery?      [facility]  What is your office phone and fax number?      Phone- [phone]  [phone]  Anesthesia type (None, local, MAC, general) ?       MAC

## 2021-01-23 ENCOUNTER — Encounter: Payer: Medicare HMO | Admitting: Internal Medicine

## 2021-01-24 ENCOUNTER — Encounter: Payer: Self-pay | Admitting: Internal Medicine

## 2021-01-24 ENCOUNTER — Ambulatory Visit (AMBULATORY_SURGERY_CENTER): Payer: Medicare HMO | Admitting: Internal Medicine

## 2021-01-24 VITALS — BP 128/61 | HR 53 | Temp 98.6°F | Resp 17 | Ht 67.0 in | Wt 110.0 lb

## 2021-01-24 DIAGNOSIS — D122 Benign neoplasm of ascending colon: Secondary | ICD-10-CM

## 2021-01-24 DIAGNOSIS — D120 Benign neoplasm of cecum: Secondary | ICD-10-CM

## 2021-01-24 DIAGNOSIS — Z86010 Personal history of colonic polyps: Secondary | ICD-10-CM

## 2021-01-24 DIAGNOSIS — D125 Benign neoplasm of sigmoid colon: Secondary | ICD-10-CM | POA: Diagnosis not present

## 2021-01-24 MED ORDER — SODIUM CHLORIDE 0.9 % IV SOLN: 500.0000 mL | Freq: Once | INTRAVENOUS | Status: DC

## 2021-01-24 NOTE — Op Note (Signed)
[facility] Patient Name: Anita Donovan Procedure Date: 01/24/2021 9:20 AM MRN: [id number] Endoscopist: Jerene Bears , MD Age: 70 Referring MD:  Date of Birth: November 26, 1950 Gender: Female Account #: [id number] Procedure:                Colonoscopy Indications:              High risk colon cancer surveillance: Personal                            history of multiple adenomas, Last colonoscopy:                            June 2019 (6 adenomas removed) Medicines:                Monitored Anesthesia Care Procedure:                Pre-Anesthesia Assessment:                           - Prior to the procedure, a History and Physical                            was performed, and patient medications and                            allergies were reviewed. The patient's tolerance of                            previous anesthesia was also reviewed. The risks                            and benefits of the procedure and the sedation                            options and risks were discussed with the patient.                            All questions were answered, and informed consent                            was obtained. Prior Anticoagulants: The patient has                            taken Eliquis (apixaban), last dose was 2 days                            prior to procedure. ASA Grade Assessment: II - A                            patient with mild systemic disease. After reviewing                            the risks and benefits, the patient was deemed in  satisfactory condition to undergo the procedure.                           After obtaining informed consent, the colonoscope                            was passed under direct vision. Throughout the                            procedure, the patient's blood pressure, pulse, and                            oxygen saturations were monitored continuously. The                            PCF-HQ190L Colonoscope  was introduced through the                            anus and advanced to the the cecum, identified by                            appendiceal orifice and ileocecal valve. The                            colonoscopy was performed without difficulty with                            use of abdominal counter pressure. The patient                            tolerated the procedure well. The quality of the                            bowel preparation was good. The ileocecal valve,                            appendiceal orifice, and rectum were photographed. Scope In: 9:30:24 AM Scope Out: 9:53:40 AM Scope Withdrawal Time: 0 hours 16 minutes 56 seconds  Total Procedure Duration: 0 hours 23 minutes 16 seconds  Findings:                 The digital rectal exam was normal.                           Two sessile polyps were found in the cecum. The                            polyps were 2 to 3 mm in size. These polyps were                            removed with a cold snare. Resection and retrieval                            were complete.  Two sessile polyps were found in the ascending                            colon. The polyps were 4 to 6 mm in size. These                            polyps were removed with a cold snare. Resection                            and retrieval were complete.                           Two sessile polyps were found in the sigmoid colon.                            The polyps were 2 to 5 mm in size. These polyps                            were removed with a cold snare. Resection was                            complete, but the polyp tissue was only partially                            retrieved.                           Retroflexion in the rectum was not performed due to                            narrow rectal vault. No pathology seen in the                            distal rectum on anorectal canal. Complications:            No immediate  complications. Estimated Blood Loss:     Estimated blood loss was minimal. Impression:               - Two 2 to 3 mm polyps in the cecum, removed with a                            cold snare. Resected and retrieved.                           - Two 4 to 6 mm polyps in the ascending colon,                            removed with a cold snare. Resected and retrieved.                           - Two 2 to 5 mm polyps in the sigmoid colon,  removed with a cold snare. Complete resection.                            Partial retrieval. Recommendation:           - Patient has a contact number available for                            emergencies. The signs and symptoms of potential                            delayed complications were discussed with the                            patient. Return to normal activities tomorrow.                            Written discharge instructions were provided to the                            patient.                           - Resume previous diet.                           - Continue present medications.                           - Await pathology results.                           - Repeat colonoscopy is recommended for                            surveillance. The colonoscopy date will be                            determined after pathology results from today's                            exam become available for review.                           - Resume Eliquis (apixaban) at prior dose today.                            Refer to managing physician for further adjustment                            of therapy. Jerene Bears, MD 01/24/2021 9:57:43 AM This report has been signed electronically.

## 2021-01-24 NOTE — Progress Notes (Signed)
Called to room to assist during endoscopic procedure.  Patient ID and intended procedure confirmed with present staff. Received instructions for my participation in the procedure from the performing physician.  

## 2021-01-24 NOTE — Progress Notes (Signed)
Addendum: Reviewed and agree with assessment and management plan. Belissa Kooy M, MD  

## 2021-01-24 NOTE — Progress Notes (Signed)
Report given to PACU, vss 

## 2021-01-24 NOTE — Progress Notes (Signed)
Patient presents for outpatient colonoscopy.  Seen recently in the office by Ellouise Newer, PA-C. See that note for details.  No significant changes to medical history since that time. Patient tolerated the prep. She remains appropriate for outpatient colonoscopy in the [facility]. She has been off Eliquis x2 days. No chest pain or shortness of breath today.

## 2021-01-24 NOTE — Progress Notes (Signed)
Pt's states no medical or surgical changes since previsit or office visit. 

## 2021-01-24 NOTE — Patient Instructions (Signed)
Discharge instructions given. Handout on polyps. Resume previous medications. YOU HAD AN ENDOSCOPIC PROCEDURE TODAY AT THE [facility]:   Refer to the procedure report that was given to you for any specific questions about what was found during the examination.  If the procedure report does not answer your questions, please call your gastroenterologist to clarify.  If you requested that your care partner not be given the details of your procedure findings, then the procedure report has been included in a sealed envelope for you to review at your convenience later.  YOU SHOULD EXPECT: Some feelings of bloating in the abdomen. Passage of more gas than usual.  Walking can help get rid of the air that was put into your GI tract during the procedure and reduce the bloating. If you had a lower endoscopy (such as a colonoscopy or flexible sigmoidoscopy) you may notice spotting of blood in your stool or on the toilet paper. If you underwent a bowel prep for your procedure, you may not have a normal bowel movement for a few days.  Please Note:  You might notice some irritation and congestion in your nose or some drainage.  This is from the oxygen used during your procedure.  There is no need for concern and it should clear up in a day or so.  SYMPTOMS TO REPORT IMMEDIATELY:  Following lower endoscopy (colonoscopy or flexible sigmoidoscopy):  Excessive amounts of blood in the stool  Significant tenderness or worsening of abdominal pains  Swelling of the abdomen that is new, acute  Fever of 100F or higher   For urgent or emergent issues, a gastroenterologist can be reached at any hour by calling [phone]. Do not use MyChart messaging for urgent concerns.    DIET:  We do recommend a small meal at first, but then you may proceed to your regular diet.  Drink plenty of fluids but you should avoid alcoholic beverages for 24 hours.  ACTIVITY:  You should plan to take it easy for the rest  of today and you should NOT DRIVE or use heavy machinery until tomorrow (because of the sedation medicines used during the test).    FOLLOW UP: Our staff will call the number listed on your records 48-72 hours following your procedure to check on you and address any questions or concerns that you may have regarding the information given to you following your procedure. If we do not reach you, we will leave a message.  We will attempt to reach you two times.  During this call, we will ask if you have developed any symptoms of COVID 19. If you develop any symptoms (ie: fever, flu-like symptoms, shortness of breath, cough etc.) before then, please call [phone].  If you test positive for Covid 19 in the 2 weeks post procedure, please call and report this information to us.    If any biopsies were taken you will be contacted by phone or by letter within the next 1-3 weeks.  Please call us at [phone] if you have not heard about the biopsies in 3 weeks.    SIGNATURES/CONFIDENTIALITY: You and/or your care partner have signed paperwork which will be entered into your electronic medical record.  These signatures attest to the fact that that the information above on your After Visit Summary has been reviewed and is understood.  Full responsibility of the confidentiality of this discharge information lies with you and/or your care-partner.  

## 2021-01-26 ENCOUNTER — Telehealth: Payer: Self-pay | Admitting: *Deleted

## 2021-01-26 NOTE — Telephone Encounter (Signed)
  Follow up Call-  Call back number 01/24/2021  Post procedure Call Back phone  # [phone]  Permission to leave phone message Yes  Some recent data might be hidden     Patient questions:  Do you have a fever, pain , or abdominal swelling? No. Pain Score  0 *  Have you tolerated food without any problems? Yes.    Have you been able to return to your normal activities? Yes.    Do you have any questions about your discharge instructions: Diet   No. Medications  No. Follow up visit  No.  Do you have questions or concerns about your Care? No.  Actions: * If pain score is 4 or above: No action needed, pain <4.

## 2021-01-31 ENCOUNTER — Encounter: Payer: Self-pay | Admitting: Internal Medicine

## 2021-04-12 ENCOUNTER — Other Ambulatory Visit: Payer: Self-pay | Admitting: General Practice

## 2021-04-12 NOTE — Telephone Encounter (Signed)
Prescription refill request for Eliquis received. Indication:Afib Last office visit:9/22 Scr:0.6 Age: 71 Weight:49.9 kg  Prescription refilled

## 2021-04-19 NOTE — Progress Notes (Signed)
HPI:FU atrial fibrillation. The patient was seen by Dr. Rollene Fare in June 2011 following an episode of palpitations requiring evaluation in the emergency room. There were apparently no rhythm strips available and he felt as though she may be having SVT. Abdominal ultrasound March 2016 showed no aneurysm. Admitted January 2022 with mildly elevated troponin and chest pain.  Echocardiogram January 2022 showed normal LV function.  Cardiac catheterization January 2022 showed vessel tortuosity but no obstructive coronary disease.  Symptoms felt possibly secondary to vasospasm.  Recently seen in the office with palpitations and found to be in atrial fibrillation.  Patient had successful cardioversion on December 20, 2020.  Since last seen, she denies dyspnea, chest pain, palpitations, syncope or bleeding.  Current Outpatient Medications  Medication Sig Dispense Refill   alendronate (FOSAMAX) 70 MG tablet Take 1 tablet by mouth every Monday.  3   apixaban (ELIQUIS) 5 MG TABS tablet TAKE 1 TABLET(5 MG) BY MOUTH TWICE DAILY 60 tablet 5   b complex vitamins capsule Take 1 capsule by mouth daily.     Calcium Carbonate-Vitamin D (CALTRATE 600+D PO) Take 1 capsule by mouth daily.     carvedilol (COREG) 6.25 MG tablet TAKE 1 TABLET(6.25 MG) BY MOUTH TWICE DAILY 180 tablet 1   Coenzyme Q10 (COQ10 PO) Take 10 mLs by mouth daily.     Fluticasone-Umeclidin-Vilant 100-62.5-25 MCG/INH AEPB Inhale 1 puff into the lungs daily.     levothyroxine (SYNTHROID) 50 MCG tablet Take 50 mcg by mouth daily before breakfast.     metoprolol succinate (TOPROL-XL) 25 MG 24 hr tablet Take 25 mg by mouth daily as needed (rapid heart rate).     Milk Thistle 250 MG CAPS Take 250 mg by mouth 2 (two) times daily.     Multiple Vitamins-Minerals (CENTRUM SILVER ADULT 50+ PO) Take 1 tablet by mouth daily.     Omega-3 Fatty Acids (FISH OIL) 1200 MG CAPS Take 1 capsule by mouth daily.     nitroGLYCERIN (NITROSTAT) 0.4 MG SL tablet Place  1 tablet (0.4 mg total) under the tongue every 5 (five) minutes as needed for chest pain. (Patient not taking: Reported on 01/24/2021) 25 tablet 3   No current facility-administered medications for this visit.     Past Medical History:  Diagnosis Date   Allergy    Arthritis    knee right   Multiple thyroid nodules    Osteoporosis    Persistent atrial fibrillation ([place] [facility]) 12/20/2020   SVT (supraventricular tachycardia) ([facility])     Past Surgical History:  Procedure Laterality Date   CARDIOVERSION N/A 12/20/2020   Procedure: CARDIOVERSION;  Surgeon: Skeet Latch, MD;  Location: [facility];  Service: Cardiovascular;  Laterality: N/A;   COLONOSCOPY     LEFT HEART CATH AND CORONARY ANGIOGRAPHY N/A 04/05/2020   Procedure: LEFT HEART CATH AND CORONARY ANGIOGRAPHY;  Surgeon: Belva Crome, MD;  Location: [facility];  Service: Cardiovascular;  Laterality: N/A;   POLYPECTOMY     TONSILLECTOMY      Social History   Socioeconomic History   Marital status: Widowed    Spouse name: Not on file   Number of children: Not on file   Years of education: Not on file   Highest education level: Not on file  Occupational History   Not on file  Tobacco Use   Smoking status: Former    Types: Cigarettes    Quit date: 04/08/2017    Years since quitting: 4.0  Smokeless tobacco: Never  Substance and Sexual Activity   Alcohol use: Yes    Alcohol/week: 0.0 standard drinks    Comment: 2 glasses per night   Drug use: Never   Sexual activity: Not on file  Other Topics Concern   Not on file  Social History Narrative   Not on file   Social Determinants of Health   Financial Resource Strain: Not on file  Food Insecurity: Not on file  Transportation Needs: Not on file  Physical Activity: Not on file  Stress: Not on file  Social Connections: Not on file  Intimate Partner Violence: Not on file    Family History  Problem Relation Age of Onset   Heart disease Mother        Stents    Colon cancer Neg Hx    Colon polyps Neg Hx    Esophageal cancer Neg Hx    Rectal cancer Neg Hx    Stomach cancer Neg Hx     ROS: no fevers or chills, productive cough, hemoptysis, dysphasia, odynophagia, melena, hematochezia, dysuria, hematuria, rash, seizure activity, orthopnea, PND, pedal edema, claudication. Remaining systems are negative.  Physical Exam: Well-developed well-nourished in no acute distress.  Skin is warm and dry.  HEENT is normal.  Neck is supple.  Chest is clear to auscultation with normal expansion.  Cardiovascular exam is regular rate and rhythm.  Abdominal exam nontender or distended. No masses palpated. Extremities show no edema. neuro grossly intact  A/P  1 paroxysmal atrial fibrillation-patient is in sinus rhythm today.  Continue Toprol and apixaban.  We will consider addition of antiarrhythmic in the future with recurrent episodes versus referral for ablation.  2 history of possible non-ST elevation myocardial infarction-catheterization at that time revealed no coronary disease with normal LV function.  This was felt possibly secondary to vasospasm.  3 palpitations-symptoms are controlled.  Continue beta-blocker at present dose.  4 question history of SVT-we will continue beta-blocker.  Kirk Ruths, MD

## 2021-04-20 ENCOUNTER — Other Ambulatory Visit: Payer: Self-pay | Admitting: Cardiology

## 2021-05-02 ENCOUNTER — Encounter: Payer: Self-pay | Admitting: Cardiology

## 2021-05-02 ENCOUNTER — Other Ambulatory Visit: Payer: Self-pay

## 2021-05-02 ENCOUNTER — Ambulatory Visit: Payer: Medicare HMO | Admitting: Cardiology

## 2021-05-02 VITALS — BP 130/80 | HR 53 | Ht 67.0 in | Wt 117.6 lb

## 2021-05-02 DIAGNOSIS — I471 Supraventricular tachycardia: Secondary | ICD-10-CM

## 2021-05-02 DIAGNOSIS — I480 Paroxysmal atrial fibrillation: Secondary | ICD-10-CM | POA: Diagnosis not present

## 2021-05-02 DIAGNOSIS — R002 Palpitations: Secondary | ICD-10-CM | POA: Diagnosis not present

## 2021-05-02 NOTE — Patient Instructions (Signed)

## 2021-08-05 ENCOUNTER — Inpatient Hospital Stay (HOSPITAL_COMMUNITY)
Admission: EM | Admit: 2021-08-05 | Discharge: 2021-08-11 | DRG: 372 | Disposition: A | Discharge status: Home / Self Care | Payer: Medicare HMO | Attending: Diagnostic Radiology | Admitting: Diagnostic Radiology

## 2021-08-05 ENCOUNTER — Emergency Department (HOSPITAL_COMMUNITY): Payer: Medicare HMO

## 2021-08-05 ENCOUNTER — Other Ambulatory Visit: Payer: Self-pay

## 2021-08-05 ENCOUNTER — Encounter (HOSPITAL_COMMUNITY): Payer: Self-pay | Admitting: *Deleted

## 2021-08-05 DIAGNOSIS — Z8249 Family history of ischemic heart disease and other diseases of the circulatory system: Secondary | ICD-10-CM

## 2021-08-05 DIAGNOSIS — K3532 Acute appendicitis with perforation and localized peritonitis, without abscess: Secondary | ICD-10-CM | POA: Diagnosis present

## 2021-08-05 DIAGNOSIS — Z79890 Hormone replacement therapy (postmenopausal): Secondary | ICD-10-CM | POA: Diagnosis not present

## 2021-08-05 DIAGNOSIS — K381 Appendicular concretions: Secondary | ICD-10-CM | POA: Diagnosis present

## 2021-08-05 DIAGNOSIS — Z7983 Long term (current) use of bisphosphonates: Secondary | ICD-10-CM

## 2021-08-05 DIAGNOSIS — E039 Hypothyroidism, unspecified: Secondary | ICD-10-CM | POA: Diagnosis present

## 2021-08-05 DIAGNOSIS — Z7901 Long term (current) use of anticoagulants: Secondary | ICD-10-CM

## 2021-08-05 DIAGNOSIS — I4819 Other persistent atrial fibrillation: Secondary | ICD-10-CM | POA: Diagnosis present

## 2021-08-05 DIAGNOSIS — K3533 Acute appendicitis with perforation and localized peritonitis, with abscess: Secondary | ICD-10-CM | POA: Diagnosis present

## 2021-08-05 DIAGNOSIS — N179 Acute kidney failure, unspecified: Secondary | ICD-10-CM | POA: Diagnosis present

## 2021-08-05 DIAGNOSIS — Z87891 Personal history of nicotine dependence: Secondary | ICD-10-CM | POA: Diagnosis not present

## 2021-08-05 DIAGNOSIS — I252 Old myocardial infarction: Secondary | ICD-10-CM

## 2021-08-05 DIAGNOSIS — M810 Age-related osteoporosis without current pathological fracture: Secondary | ICD-10-CM | POA: Diagnosis present

## 2021-08-05 DIAGNOSIS — Z79899 Other long term (current) drug therapy: Secondary | ICD-10-CM | POA: Diagnosis not present

## 2021-08-05 LAB — URINALYSIS, ROUTINE W REFLEX MICROSCOPIC
Bacteria, UA: NONE SEEN
Bilirubin Urine: NEGATIVE
Glucose, UA: NEGATIVE mg/dL
Hgb urine dipstick: NEGATIVE
Ketones, ur: 20 mg/dL — AB
Leukocytes,Ua: NEGATIVE
Nitrite: NEGATIVE
Protein, ur: 30 mg/dL — AB
Specific Gravity, Urine: >1.046 — ABNORMAL HIGH (ref 1.005–1.030)
pH: 5 (ref 5.0–8.0)

## 2021-08-05 LAB — CBC
HCT: 38.9 % (ref 36.0–46.0)
Hemoglobin: 13.1 g/dL (ref 12.0–15.0)
MCH: 32 pg (ref 26.0–34.0)
MCHC: 33.7 g/dL (ref 30.0–36.0)
MCV: 94.9 fL (ref 80.0–100.0)
Platelets: 189 10*3/uL (ref 150–400)
RBC: 4.1 MIL/uL (ref 3.87–5.11)
RDW: 12.4 % (ref 11.5–15.5)
WBC: 13.4 10*3/uL — ABNORMAL HIGH (ref 4.0–10.5)
nRBC: 0 % (ref 0.0–0.2)

## 2021-08-05 LAB — COMPREHENSIVE METABOLIC PANEL
ALT: 22 U/L (ref 0–44)
AST: 24 U/L (ref 15–41)
Albumin: 4.1 g/dL (ref 3.5–5.0)
Alkaline Phosphatase: 58 U/L (ref 38–126)
Anion gap: 14 (ref 5–15)
BUN: 27 mg/dL — ABNORMAL HIGH (ref 8–23)
CO2: 18 mmol/L — ABNORMAL LOW (ref 22–32)
Calcium: 9.2 mg/dL (ref 8.9–10.3)
Chloride: 100 mmol/L (ref 98–111)
Creatinine, Ser: 1.14 mg/dL — ABNORMAL HIGH (ref 0.44–1.00)
GFR, Estimated: 51 mL/min — ABNORMAL LOW (ref >60)
Glucose, Bld: 132 mg/dL — ABNORMAL HIGH (ref 70–99)
Potassium: 3.5 mmol/L (ref 3.5–5.1)
Sodium: 132 mmol/L — ABNORMAL LOW (ref 135–145)
Total Bilirubin: 1.9 mg/dL — ABNORMAL HIGH (ref 0.3–1.2)
Total Protein: 7.7 g/dL (ref 6.5–8.1)

## 2021-08-05 LAB — LIPASE, BLOOD: Lipase: 23 U/L (ref 11–51)

## 2021-08-05 MED ORDER — DIPHENHYDRAMINE HCL 12.5 MG/5ML PO ELIX: 12.5000 mg | ORAL_SOLUTION | Freq: Four times a day (QID) | ORAL | Status: DC | PRN

## 2021-08-05 MED ORDER — METRONIDAZOLE 500 MG/100ML IV SOLN
500.0000 mg | Freq: Two times a day (BID) | INTRAVENOUS | Status: DC
  Administered 2021-08-06 – 2021-08-11 (×11): 500 mg via INTRAVENOUS
  Filled 2021-08-05 (×11): qty 100

## 2021-08-05 MED ORDER — ACETAMINOPHEN 650 MG RE SUPP: 650.0000 mg | Freq: Four times a day (QID) | RECTAL | Status: DC | PRN

## 2021-08-05 MED ORDER — CARVEDILOL 6.25 MG PO TABS
6.2500 mg | ORAL_TABLET | Freq: Two times a day (BID) | ORAL | Status: DC
  Administered 2021-08-05 – 2021-08-11 (×11): 6.25 mg via ORAL
  Filled 2021-08-05 (×11): qty 1

## 2021-08-05 MED ORDER — METOPROLOL SUCCINATE ER 25 MG PO TB24: 25.0000 mg | ORAL_TABLET | Freq: Every day | ORAL | Status: DC | PRN

## 2021-08-05 MED ORDER — POTASSIUM CHLORIDE IN NACL 20-0.45 MEQ/L-% IV SOLN
INTRAVENOUS | Status: DC
  Filled 2021-08-05 (×9): qty 1000

## 2021-08-05 MED ORDER — MORPHINE SULFATE (PF) 2 MG/ML IV SOLN
2.0000 mg | INTRAVENOUS | Status: DC | PRN
  Administered 2021-08-06 – 2021-08-08 (×2): 2 mg via INTRAVENOUS
  Filled 2021-08-05 (×2): qty 1

## 2021-08-05 MED ORDER — SODIUM CHLORIDE 0.9 % IV BOLUS
1000.0000 mL | Freq: Once | INTRAVENOUS | Status: AC
  Administered 2021-08-05: 1000 mL via INTRAVENOUS

## 2021-08-05 MED ORDER — ENOXAPARIN SODIUM 40 MG/0.4ML IJ SOSY
40.0000 mg | PREFILLED_SYRINGE | INTRAMUSCULAR | Status: DC
  Administered 2021-08-05 – 2021-08-06 (×2): 40 mg via SUBCUTANEOUS
  Filled 2021-08-05 (×2): qty 0.4

## 2021-08-05 MED ORDER — LEVOTHYROXINE SODIUM 50 MCG PO TABS
50.0000 ug | ORAL_TABLET | Freq: Every day | ORAL | Status: DC
  Administered 2021-08-06 – 2021-08-11 (×4): 50 ug via ORAL
  Filled 2021-08-05 (×6): qty 1

## 2021-08-05 MED ORDER — FLUTICASONE-UMECLIDIN-VILANT 100-62.5-25 MCG/INH IN AEPB: 1.0000 | INHALATION_SPRAY | Freq: Every day | RESPIRATORY_TRACT | Status: DC

## 2021-08-05 MED ORDER — ONDANSETRON HCL 4 MG/2ML IJ SOLN: 4.0000 mg | Freq: Four times a day (QID) | INTRAMUSCULAR | Status: DC | PRN

## 2021-08-05 MED ORDER — METRONIDAZOLE 500 MG/100ML IV SOLN
500.0000 mg | Freq: Once | INTRAVENOUS | Status: AC
  Administered 2021-08-05: 500 mg via INTRAVENOUS
  Filled 2021-08-05: qty 100

## 2021-08-05 MED ORDER — IOHEXOL 300 MG/ML  SOLN
80.0000 mL | Freq: Once | INTRAMUSCULAR | Status: AC | PRN
  Administered 2021-08-05: 80 mL via INTRAVENOUS

## 2021-08-05 MED ORDER — SODIUM CHLORIDE 0.9 % IV SOLN
2.0000 g | Freq: Once | INTRAVENOUS | Status: AC
  Administered 2021-08-05: 2 g via INTRAVENOUS
  Filled 2021-08-05: qty 12.5

## 2021-08-05 MED ORDER — SIMETHICONE 80 MG PO CHEW: 40.0000 mg | CHEWABLE_TABLET | Freq: Four times a day (QID) | ORAL | Status: DC | PRN

## 2021-08-05 MED ORDER — SODIUM CHLORIDE (PF) 0.9 % IJ SOLN
INTRAMUSCULAR | Status: AC
  Filled 2021-08-05: qty 50

## 2021-08-05 MED ORDER — ONDANSETRON 4 MG PO TBDP: 4.0000 mg | ORAL_TABLET | Freq: Four times a day (QID) | ORAL | Status: DC | PRN

## 2021-08-05 MED ORDER — ACETAMINOPHEN 325 MG PO TABS
650.0000 mg | ORAL_TABLET | Freq: Four times a day (QID) | ORAL | Status: DC | PRN
  Administered 2021-08-05 – 2021-08-11 (×2): 650 mg via ORAL
  Filled 2021-08-05 (×2): qty 2

## 2021-08-05 MED ORDER — DIPHENHYDRAMINE HCL 50 MG/ML IJ SOLN: 12.5000 mg | Freq: Four times a day (QID) | INTRAMUSCULAR | Status: DC | PRN

## 2021-08-05 MED ORDER — FLUTICASONE FUROATE-VILANTEROL 100-25 MCG/ACT IN AEPB
1.0000 | INHALATION_SPRAY | Freq: Every day | RESPIRATORY_TRACT | Status: DC
  Administered 2021-08-06 – 2021-08-11 (×6): 1 via RESPIRATORY_TRACT
  Filled 2021-08-05: qty 28

## 2021-08-05 MED ORDER — SODIUM CHLORIDE 0.9 % IV SOLN
2.0000 g | INTRAVENOUS | Status: DC
  Administered 2021-08-05 – 2021-08-10 (×6): 2 g via INTRAVENOUS
  Filled 2021-08-05 (×6): qty 20

## 2021-08-05 MED ORDER — UMECLIDINIUM BROMIDE 62.5 MCG/ACT IN AEPB
1.0000 | INHALATION_SPRAY | Freq: Every day | RESPIRATORY_TRACT | Status: DC
  Administered 2021-08-06 – 2021-08-11 (×6): 1 via RESPIRATORY_TRACT
  Filled 2021-08-05: qty 7

## 2021-08-05 NOTE — ED Provider Notes (Signed)
?[facility] DEPT ?Provider Note ? ? ?CSN: [id number] ?Arrival date & time: 08/05/21  1120 ? ?  ? ?History ? ?Chief Complaint  ?Patient presents with  ? Abdominal Pain  ? ? ?Anita Donovan is a 71 y.o. female. With past medical history of paroxysmal AF anticoagulated on Eliquis, NSTEMI, osteoporosis who presents to the emergency department with abdominal pain. ? ?States that for 2-3 days now she has had abdominal pain. She describes it as sudden onset, and more right sided than left. Describes it as hurting but has difficulty elaborating its characteristics. She states it is worse with standing or bending over. She has tried one ibuprofen without relief of symptoms. She is now having non-bloody diarrhea as of last night. Endorses decreased PO intake. She denies fever, nausea or vomiting, urinary symptoms. ? ? ?Abdominal Pain ?Associated symptoms: diarrhea   ?Associated symptoms: no fever, no nausea and no vomiting   ? ?  ? ?Home Medications ?Prior to Admission medications   ?Medication Sig Start Date End Date Taking? Authorizing Provider  ?alendronate (FOSAMAX) 70 MG tablet Take 1 tablet by mouth every Monday. 04/20/16   [provider]  ?apixaban (ELIQUIS) 5 MG TABS tablet TAKE 1 TABLET(5 MG) BY MOUTH TWICE DAILY 04/12/21   Deberah Pelton, NP  ?b complex vitamins capsule Take 1 capsule by mouth daily.    [provider]  ?Calcium Carbonate-Vitamin D (CALTRATE 600+D PO) Take 1 capsule by mouth daily.    [provider]  ?carvedilol (COREG) 6.25 MG tablet TAKE 1 TABLET(6.25 MG) BY MOUTH TWICE DAILY 04/20/21   Lelon Perla, MD  ?Coenzyme Q10 (COQ10 PO) Take 10 mLs by mouth daily.    [provider]  ?Fluticasone-Umeclidin-Vilant 100-62.5-25 MCG/INH AEPB Inhale 1 puff into the lungs daily. 11/22/20   [provider]  ?levothyroxine (SYNTHROID) 50 MCG tablet Take 50 mcg by mouth daily before breakfast.    [provider]  ?metoprolol  succinate (TOPROL-XL) 25 MG 24 hr tablet Take 25 mg by mouth daily as needed (rapid heart rate).    [provider]  ?Milk Thistle 250 MG CAPS Take 250 mg by mouth 2 (two) times daily.    [provider]  ?Multiple Vitamins-Minerals (CENTRUM SILVER ADULT 50+ PO) Take 1 tablet by mouth daily.    [provider]  ?nitroGLYCERIN (NITROSTAT) 0.4 MG SL tablet Place 1 tablet (0.4 mg total) under the tongue every 5 (five) minutes as needed for chest pain. ?Patient not taking: Reported on 01/24/2021 04/05/20 04/05/21  Abigail Butts., PA-C  ?Omega-3 Fatty Acids (FISH OIL) 1200 MG CAPS Take 1 capsule by mouth daily.    [provider]  ?   ? ?Allergies    ?Patient has no known allergies.   ? ?Review of Systems   ?Review of Systems  ?Constitutional:  Negative for fever.  ?Gastrointestinal:  Positive for abdominal pain and diarrhea. Negative for blood in stool, nausea and vomiting.  ?All other systems reviewed and are negative. ? ?Physical Exam ?Updated Vital Signs ?BP 123/63 (BP Location: Left Arm)   Pulse (!) 57   Temp 97.6 ?F (36.4 ?C) (Oral)   Resp 16   Ht '5\' 7"'$  (1.702 m)   Wt 52.6 kg   SpO2 96%   BMI 18.17 kg/m?  ?Physical Exam ?Vitals and nursing note reviewed.  ?Constitutional:   ?   General: She is not in acute distress. ?   Appearance: Normal appearance. She is well-developed. She  is ill-appearing. She is not toxic-appearing.  ?HENT:  ?   Head: Normocephalic and atraumatic.  ?   Mouth/Throat:  ?   Mouth: Mucous membranes are moist.  ?   Pharynx: Oropharynx is clear.  ?Eyes:  ?   General: No scleral icterus. ?   Extraocular Movements: Extraocular movements intact.  ?Cardiovascular:  ?   Rate and Rhythm: Normal rate. Rhythm irregularly irregular.  ?   Pulses: Normal pulses.  ?   Heart sounds: No murmur heard. ?Pulmonary:  ?   Effort: Pulmonary effort is normal. No respiratory distress.  ?   Breath sounds: Normal breath sounds.  ?Abdominal:  ?   General: Abdomen is flat. Bowel  sounds are decreased.  ?   Palpations: Abdomen is soft.  ?   Tenderness: There is generalized abdominal tenderness and tenderness in the right lower quadrant and periumbilical area. There is guarding.  ? ? ?Skin: ?   General: Skin is warm and dry.  ?   Capillary Refill: Capillary refill takes less than 2 seconds.  ?Neurological:  ?   General: No focal deficit present.  ?   Mental Status: She is alert and oriented to person, place, and time.  ?Psychiatric:     ?   Mood and Affect: Mood normal.     ?   Behavior: Behavior normal.  ? ? ?ED Results / Procedures / Treatments   ?Labs ?(all labs ordered are listed, but only abnormal results are displayed) ?Labs Reviewed  ?COMPREHENSIVE METABOLIC PANEL - Abnormal; Notable for the following components:  ?    Result Value  ? Sodium 132 (*)   ? CO2 18 (*)   ? Glucose, Bld 132 (*)   ? BUN 27 (*)   ? Creatinine, Ser 1.14 (*)   ? Total Bilirubin 1.9 (*)   ? GFR, Estimated 51 (*)   ? All other components within normal limits  ?CBC - Abnormal; Notable for the following components:  ? WBC 13.4 (*)   ? All other components within normal limits  ?LIPASE, BLOOD  ?URINALYSIS, ROUTINE W REFLEX MICROSCOPIC  ? ?EKG ?None ? ?Radiology ?CT Abdomen Pelvis W Contrast ? ?Result Date: 08/05/2021 ?CLINICAL DATA:  Abdominal pain and diarrhea for several days. EXAM: CT ABDOMEN AND PELVIS WITH CONTRAST TECHNIQUE: Multidetector CT imaging of the abdomen and pelvis was performed using the standard protocol following bolus administration of intravenous contrast. RADIATION DOSE REDUCTION: This exam was performed according to the departmental dose-optimization program which includes automated exposure control, adjustment of the mA and/or kV according to patient size and/or use of iterative reconstruction technique. CONTRAST:  37m OMNIPAQUE IOHEXOL 300 MG/ML  SOLN COMPARISON:  None Available. FINDINGS: Lower Chest: No acute findings. Hepatobiliary: No hepatic masses identified. Gallbladder is unremarkable.  No evidence of biliary ductal dilatation. Pancreas:  No mass or inflammatory changes. Spleen: Within normal limits in size and appearance. Adrenals/Urinary Tract: No masses identified. No evidence of ureteral calculi or hydronephrosis. Stomach/Bowel: Perforated appendicitis is seen, with rim enhancing fluid and gas collection in the periappendiceal region which measures 3.9 x 3.4 cm. This is consistent with periappendiceal abscess. Mild dilatation of small bowel loops is seen without transition point, consistent with ileus. Vascular/Lymphatic: No pathologically enlarged lymph nodes. No acute vascular findings. Aortic atherosclerotic calcification noted. Reproductive:  No mass or other significant abnormality. Other:  None. Musculoskeletal:  No suspicious bone lesions identified. IMPRESSION: Perforated appendicitis, with 3.9 cm periappendiceal abscess. Ileus. Aortic Atherosclerosis (ICD10-I70.0). Electronically Signed   By: JJenny Reichmann  Tereso Newcomer M.D.   On: 08/05/2021 12:50   ? ?Procedures ?Procedures  ? ?Medications Ordered in ED ?Medications  ?sodium chloride (PF) 0.9 % injection (has no administration in time range)  ?ceFEPIme (MAXIPIME) 2 g in sodium chloride 0.9 % 100 mL IVPB (2 g Intravenous New Bag/Given 08/05/21 1302)  ?  And  ?metroNIDAZOLE (FLAGYL) IVPB 500 mg (has no administration in time range)  ?sodium chloride 0.9 % bolus 1,000 mL (1,000 mLs Intravenous New Bag/Given 08/05/21 1223)  ?iohexol (OMNIPAQUE) 300 MG/ML solution 80 mL (80 mLs Intravenous Contrast Given 08/05/21 1230)  ? ? ?ED Course/ Medical Decision Making/ A&P ?  ?                        ?Medical Decision Making ?Amount and/or Complexity of Data Reviewed ?Labs: ordered. ?Radiology: ordered. ? ?Risk ?Prescription drug management. ?Decision regarding hospitalization. ? ?This patient presents to the ED for concern of abdominal pain, this involves an extensive number of treatment options, and is a complaint that carries with it a high risk of complications and  morbidity.  The differential diagnosis includes acute hepatobiliary disease, PUD, gastritis, pancreatitis, appendicitis, diverticulitis, colitis, bowel obstruction, viscous perforation, UTI, pyelonephritis, etc

## 2021-08-05 NOTE — ED Triage Notes (Signed)
Couple days of abd pain followed by diarrhea last night. Went to a walk in and was told to come to the ED ?

## 2021-08-05 NOTE — ED Notes (Signed)
Specimen cup provided. Pt encouraged to provide urine sample per MD order.  ?

## 2021-08-05 NOTE — H&P (Signed)
? ? ? ?CC: abd pain ? ?Requesting provider: Dr Francia Greaves ? ?HPI: ?Anita Donovan is an 71 y.o. female who is here for abdominal pain that started Wednesday.  It is gotten worse since then.  Last night she began to have significant diarrhea as well.  Patient denies any nausea vomiting or fevers.  Patient is on Eliquis due to A-fib. ? ?Past Medical History:  ?Diagnosis Date  ? Allergy   ? Arthritis   ? knee right  ? Multiple thyroid nodules   ? Osteoporosis   ? Persistent atrial fibrillation ([place]) 12/20/2020  ? SVT (supraventricular tachycardia) ([place])   ? ? ?Past Surgical History:  ?Procedure Laterality Date  ? CARDIOVERSION N/A 12/20/2020  ? Procedure: CARDIOVERSION;  Surgeon: Skeet Latch, MD;  Location: [place];  Service: Cardiovascular;  Laterality: N/A;  ? COLONOSCOPY    ? LEFT HEART CATH AND CORONARY ANGIOGRAPHY N/A 04/05/2020  ? Procedure: LEFT HEART CATH AND CORONARY ANGIOGRAPHY;  Surgeon: Belva Crome, MD;  Location: [place] [facility];  Service: Cardiovascular;  Laterality: N/A;  ? POLYPECTOMY    ? TONSILLECTOMY    ? ? ?Family History  ?Problem Relation Age of Onset  ? Heart disease Mother   ?     Stents  ? Colon cancer Neg Hx   ? Colon polyps Neg Hx   ? Esophageal cancer Neg Hx   ? Rectal cancer Neg Hx   ? Stomach cancer Neg Hx   ? ? ?Social:  reports that she quit smoking about 4 years ago. Her smoking use included cigarettes. She has never used smokeless tobacco. She reports current alcohol use. She reports that she does not use drugs. ? ?Allergies: No Known Allergies ? ?Medications: I have reviewed the patient's current medications. ? ?Results for orders placed or performed during the hospital encounter of 08/05/21 (from the past 48 hour(s))  ?Lipase, blood     Status: None  ? Collection Time: 08/05/21 11:40 AM  ?Result Value Ref Range  ? Lipase 23 11 - 51 U/L  ?  Comment: Performed at [facility], [place] [address] [place], [place] [address]  ?Comprehensive metabolic panel      Status: Abnormal  ? Collection Time: 08/05/21 11:40 AM  ?Result Value Ref Range  ? Sodium 132 (L) 135 - 145 mmol/L  ? Potassium 3.5 3.5 - 5.1 mmol/L  ? Chloride 100 98 - 111 mmol/L  ? CO2 18 (L) 22 - 32 mmol/L  ? Glucose, Bld 132 (H) 70 - 99 mg/dL  ?  Comment: Glucose reference range applies only to samples taken after fasting for at least 8 hours.  ? BUN 27 (H) 8 - 23 mg/dL  ? Creatinine, Ser 1.14 (H) 0.44 - 1.00 mg/dL  ? Calcium 9.2 8.9 - 10.3 mg/dL  ? Total Protein 7.7 6.5 - 8.1 g/dL  ? Albumin 4.1 3.5 - 5.0 g/dL  ? AST 24 15 - 41 U/L  ? ALT 22 0 - 44 U/L  ? Alkaline Phosphatase 58 38 - 126 U/L  ? Total Bilirubin 1.9 (H) 0.3 - 1.2 mg/dL  ? GFR, Estimated 51 (L) >60 mL/min  ?  Comment: (NOTE) ?Calculated using the CKD-EPI Creatinine Equation (2021) ?  ? Anion gap 14 5 - 15  ?  Comment: Performed at [facility], [place] [address], [place],  [address]  ?CBC     Status: Abnormal  ? Collection Time: 08/05/21 11:40 AM  ?Result Value Ref Range  ?  WBC 13.4 (H) 4.0 - 10.5 K/uL  ? RBC 4.10 3.87 - 5.11 MIL/uL  ? Hemoglobin 13.1 12.0 - 15.0 g/dL  ? HCT 38.9 36.0 - 46.0 %  ? MCV 94.9 80.0 - 100.0 fL  ? MCH 32.0 26.0 - 34.0 pg  ? MCHC 33.7 30.0 - 36.0 g/dL  ? RDW 12.4 11.5 - 15.5 %  ? Platelets 189 150 - 400 K/uL  ? nRBC 0.0 0.0 - 0.2 %  ?  Comment: Performed at [facility], [place] [address], [place], [place] [address]  ?Urinalysis, Routine w reflex microscopic     Status: Abnormal  ? Collection Time: 08/05/21  1:45 PM  ?Result Value Ref Range  ? Color, Urine YELLOW YELLOW  ? APPearance CLEAR CLEAR  ? Specific Gravity, Urine >1.046 (H) 1.005 - 1.030  ? pH 5.0 5.0 - 8.0  ? Glucose, UA NEGATIVE NEGATIVE mg/dL  ? Hgb urine dipstick NEGATIVE NEGATIVE  ? Bilirubin Urine NEGATIVE NEGATIVE  ? Ketones, ur 20 (A) NEGATIVE mg/dL  ? Protein, ur 30 (A) NEGATIVE mg/dL  ? Nitrite NEGATIVE NEGATIVE  ? Leukocytes,Ua NEGATIVE NEGATIVE  ? RBC / HPF 0-5 0 - 5 RBC/hpf  ? WBC, UA 0-5 0 - 5 WBC/hpf  ?  Bacteria, UA NONE SEEN NONE SEEN  ? Mucus PRESENT   ? Hyaline Casts, UA PRESENT   ?  Comment: Performed at [facility], [place] [address], [place], [place] [address]  ? ? ?CT Abdomen Pelvis W Contrast ? ?Result Date: 08/05/2021 ?CLINICAL DATA:  Abdominal pain and diarrhea for several days. EXAM: CT ABDOMEN AND PELVIS WITH CONTRAST TECHNIQUE: Multidetector CT imaging of the abdomen and pelvis was performed using the standard protocol following bolus administration of intravenous contrast. RADIATION DOSE REDUCTION: This exam was performed according to the departmental dose-optimization program which includes automated exposure control, adjustment of the mA and/or kV according to patient size and/or use of iterative reconstruction technique. CONTRAST:  29m OMNIPAQUE IOHEXOL 300 MG/ML  SOLN COMPARISON:  None Available. FINDINGS: Lower Chest: No acute findings. Hepatobiliary: No hepatic masses identified. Gallbladder is unremarkable. No evidence of biliary ductal dilatation. Pancreas:  No mass or inflammatory changes. Spleen: Within normal limits in size and appearance. Adrenals/Urinary Tract: No masses identified. No evidence of ureteral calculi or hydronephrosis. Stomach/Bowel: Perforated appendicitis is seen, with rim enhancing fluid and gas collection in the periappendiceal region which measures 3.9 x 3.4 cm. This is consistent with periappendiceal abscess. Mild dilatation of small bowel loops is seen without transition point, consistent with ileus. Vascular/Lymphatic: No pathologically enlarged lymph nodes. No acute vascular findings. Aortic atherosclerotic calcification noted. Reproductive:  No mass or other significant abnormality. Other:  None. Musculoskeletal:  No suspicious bone lesions identified. IMPRESSION: Perforated appendicitis, with 3.9 cm periappendiceal abscess. Ileus. Aortic Atherosclerosis (ICD10-I70.0). Electronically Signed   By: JMarlaine HindM.D.   On: 08/05/2021 12:50   ? ?ROS -  all of the below systems have been reviewed with the patient and positives are indicated with bold text ?General: chills, fever or night sweats ?Eyes: blurry vision or double vision ?ENT: epistaxis or sore throat ?Hematologic/Lymphatic: bleeding problems, blood clots or swollen lymph nodes ?Endocrine: temperature intolerance or unexpected weight changes ?Breast: new or changing breast lumps or nipple discharge ?Resp: cough, shortness of breath, or wheezing ?CV: chest pain or dyspnea on exertion ?GI: as per HPI ?GU: dysuria, trouble voiding, or hematuria ?Neuro: TIA or stroke symptoms ? ? ? ?PE ?Blood pressure 99/60, pulse 76,  temperature 97.6 ?F (36.4 ?C), temperature source Oral, resp. rate 16, height '5\' 7"'$  (1.702 m), weight 52.6 kg, SpO2 99 %. ?Constitutional: NAD; conversant; no deformities ?Eyes: Moist conjunctiva; no lid lag; anicteric; PERRL ?Neck: Trachea midline; no thyromegaly ?Lungs: Normal respiratory effort ?CV: RRR ?GI: Abd soft, TTP RLQ ?MSK: Normal range of motion of extremities; no clubbing/cyanosis ?Psychiatric: Appropriate affect; alert and oriented x3 ? ?Results for orders placed or performed during the hospital encounter of 08/05/21 (from the past 48 hour(s))  ?Lipase, blood     Status: None  ? Collection Time: 08/05/21 11:40 AM  ?Result Value Ref Range  ? Lipase 23 11 - 51 U/L  ?  Comment: Performed at [facility], [place] [address] [place], [place] [address]  ?Comprehensive metabolic panel     Status: Abnormal  ? Collection Time: 08/05/21 11:40 AM  ?Result Value Ref Range  ? Sodium 132 (L) 135 - 145 mmol/L  ? Potassium 3.5 3.5 - 5.1 mmol/L  ? Chloride 100 98 - 111 mmol/L  ? CO2 18 (L) 22 - 32 mmol/L  ? Glucose, Bld 132 (H) 70 - 99 mg/dL  ?  Comment: Glucose reference range applies only to samples taken after fasting for at least 8 hours.  ? BUN 27 (H) 8 - 23 mg/dL  ? Creatinine, Ser 1.14 (H) 0.44 - 1.00 mg/dL  ? Calcium 9.2 8.9 - 10.3 mg/dL  ? Total Protein 7.7 6.5 - 8.1 g/dL   ? Albumin 4.1 3.5 - 5.0 g/dL  ? AST 24 15 - 41 U/L  ? ALT 22 0 - 44 U/L  ? Alkaline Phosphatase 58 38 - 126 U/L  ? Total Bilirubin 1.9 (H) 0.3 - 1.2 mg/dL  ? GFR, Estimated 51 (L) >60 mL/min  ?  Comment: (

## 2021-08-06 LAB — CBC
HCT: 36.5 % (ref 36.0–46.0)
Hemoglobin: 12.1 g/dL (ref 12.0–15.0)
MCH: 31.8 pg (ref 26.0–34.0)
MCHC: 33.2 g/dL (ref 30.0–36.0)
MCV: 95.8 fL (ref 80.0–100.0)
Platelets: 207 10*3/uL (ref 150–400)
RBC: 3.81 MIL/uL — ABNORMAL LOW (ref 3.87–5.11)
RDW: 12.6 % (ref 11.5–15.5)
WBC: 11.3 10*3/uL — ABNORMAL HIGH (ref 4.0–10.5)
nRBC: 0 % (ref 0.0–0.2)

## 2021-08-06 LAB — BASIC METABOLIC PANEL
Anion gap: 11 (ref 5–15)
BUN: 25 mg/dL — ABNORMAL HIGH (ref 8–23)
CO2: 18 mmol/L — ABNORMAL LOW (ref 22–32)
Calcium: 8.8 mg/dL — ABNORMAL LOW (ref 8.9–10.3)
Chloride: 107 mmol/L (ref 98–111)
Creatinine, Ser: 0.77 mg/dL (ref 0.44–1.00)
GFR, Estimated: >60 mL/min (ref >60)
Glucose, Bld: 104 mg/dL — ABNORMAL HIGH (ref 70–99)
Potassium: 2.9 mmol/L — ABNORMAL LOW (ref 3.5–5.1)
Sodium: 136 mmol/L (ref 135–145)

## 2021-08-06 MED ORDER — ORAL CARE MOUTH RINSE
15.0000 mL | Freq: Two times a day (BID) | OROMUCOSAL | Status: DC
  Administered 2021-08-06 – 2021-08-08 (×6): 15 mL via OROMUCOSAL

## 2021-08-06 NOTE — TOC Initial Note (Signed)
Transition of Care (TOC) - Initial/Assessment Note  ? ? ?Patient Details  ?Name: Anita Donovan ?MRN: [id number] ?Date of Birth: 12-19-1950 ? ?Transition of Care (TOC) CM/SW Contact:    ?Tawanna Cooler, RN ?Phone Number: ?08/06/2021, 1:09 PM ? ?Clinical Narrative:                 ? ?Transition of Care Department [facility]) has reviewed patient and no TOC needs have been identified at this time. We will continue to monitor patient advancement through interdisciplinary progression rounds. If new patient transition needs arise, please place a TOC consult. ? ? ? ?Expected Discharge Plan: Home/Self Care ?Barriers to Discharge: Continued Medical Work up ? ? ?Expected Discharge Plan and Services ?Expected Discharge Plan: Home/Self Care ?  ?   ?Living arrangements for the past 2 months: [place] ?                ?  ?Prior Living Arrangements/Services ?Living arrangements for the past 2 months: [place] ?Lives with:: Self ?Patient language and need for interpreter reviewed:: Yes ?       ?  ?   ?Criminal Activity/Legal Involvement Pertinent to Current Situation/Hospitalization: No - Comment as needed ? ?Activities of Daily Living ?Home Assistive Devices/Equipment: Eyeglasses ?ADL Screening (condition at time of admission) ?Patient's cognitive ability adequate to safely complete daily activities?: Yes ?Is the patient deaf or have difficulty hearing?: No ?Does the patient have difficulty seeing, even when wearing glasses/contacts?: No ?Does the patient have difficulty concentrating, remembering, or making decisions?: No ?Patient able to express need for assistance with ADLs?: Yes ?Does the patient have difficulty dressing or bathing?: No ?Independently performs ADLs?: Yes (appropriate for developmental age) ?Does the patient have difficulty walking or climbing stairs?: No ?Weakness of Legs: None ?Weakness of Arms/Hands: None ? ?Emotional Assessment ? Orientation: : Oriented to Self, Oriented to Place, Oriented to  Situation, Oriented to  Time ?Alcohol / Substance Use: Not Applicable ?Psych Involvement: No (comment) ? ?Admission diagnosis:  Perforated appendicitis [K35.32] ?Acute appendicitis with perforation, localized peritonitis, and abscess, without gangrene [K35.33] ?Patient Active Problem List  ? Diagnosis Date Noted  ? Perforated appendicitis 08/05/2021  ? Menopausal symptom 01/19/2021  ? Polyp of colon 01/19/2021  ? Persistent atrial fibrillation ([place]) 12/20/2020  ? History of MI (myocardial infarction) 08/11/2020  ? Hypothyroidism 08/11/2020  ? NSTEMI (non-ST elevated myocardial infarction) ([place]) 04/05/2020  ? Underweight 09/18/2018  ? Osteoporosis 05/18/2015  ? Bruit 05/21/2014  ? Palpitations 02/12/2014  ? SVT (supraventricular tachycardia) ([place]) 02/12/2014  ? History of tobacco use 02/12/2014  ? ?PCP:  Bernerd Limbo, MD ?Pharmacy:   ?[id number] [place], [address] AT [facility] [address] ?[place] ?[place] [address] ?Phone: [phone] Fax: [phone] ? ?

## 2021-08-06 NOTE — Progress Notes (Signed)
? ? ?Assessment & Plan: ?HD#2 - acute appendicitis on anticoagulation ? - hold Eliquis - last dose 5/6 AM ? - IV Rocephin, Flagyl ? - allow clear liquid diet today, NPO after midnight tonight ? - possible OR vs IR drainage on Monday 5/8 ? ?      Anita Gemma, MD ?[facility] ?A [facility] practice ?Office: [phone] ?       ?Chief Complaint: ?Acute appendicitis ? ?Subjective: ?Patient in bed, uncomfortable.  Urinary frequency, loose BM's. ? ?Objective: ?Vital signs in last 24 hours: ?Temp:  [97.6 ?F (36.4 ?C)-100.2 ?F (37.9 ?C)] 98 ?F (36.7 ?C) (05/07 0524) ?Pulse Rate:  [57-98] 89 (05/07 0524) ?Resp:  [16-18] 16 (05/07 0524) ?BP: (93-125)/(55-75) 113/75 (05/07 0524) ?SpO2:  [95 %-100 %] 97 % (05/07 0998) ?Weight:  [52.6 kg] 52.6 kg (05/06 1129) ?Last BM Date : 08/05/21 ? ?Intake/Output from previous day: ?05/06 0701 - 05/07 0700 ?In: 2249.8 [P.O.:100; I.V.:853.1; IV Piggyback:1296.7] ?Out: -  ?Intake/Output this shift: ?No intake/output data recorded. ? ?Physical Exam: ?HEENT - sclerae clear, mucous membranes moist ?Neck - soft ?Abdomen - soft, mild distension; tenderness mid abdomen, no mass ?Ext - no edema, non-tender ?Neuro - alert & oriented, no focal deficits ? ?Lab Results:  ?Recent Labs  ?  08/05/21 ?1140 08/06/21 ?0428  ?WBC 13.4* 11.3*  ?HGB 13.1 12.1  ?HCT 38.9 36.5  ?PLT 189 207  ? ?BMET ?Recent Labs  ?  08/05/21 ?1140 08/06/21 ?0428  ?NA 132* 136  ?K 3.5 2.9*  ?CL 100 107  ?CO2 18* 18*  ?GLUCOSE 132* 104*  ?BUN 27* 25*  ?CREATININE 1.14* 0.77  ?CALCIUM 9.2 8.8*  ? ?PT/INR ?No results for input(s): LABPROT, INR in the last 72 hours. ?Comprehensive Metabolic Panel: ?   ?Component Value Date/Time  ? NA 136 08/06/2021 0428  ? NA 132 (L) 08/05/2021 1140  ? NA 135 12/12/2020 1130  ? K 2.9 (L) 08/06/2021 0428  ? K 3.5 08/05/2021 1140  ? CL 107 08/06/2021 0428  ? CL 100 08/05/2021 1140  ? CO2 18 (L) 08/06/2021 0428  ? CO2 18 (L) 08/05/2021 1140  ? BUN 25 (H) 08/06/2021 0428  ? BUN 27 (H)  08/05/2021 1140  ? BUN 17 12/12/2020 1130  ? CREATININE 0.77 08/06/2021 0428  ? CREATININE 1.14 (H) 08/05/2021 1140  ? CREATININE 0.79 02/12/2014 1521  ? GLUCOSE 104 (H) 08/06/2021 0428  ? GLUCOSE 132 (H) 08/05/2021 1140  ? CALCIUM 8.8 (L) 08/06/2021 0428  ? CALCIUM 9.2 08/05/2021 1140  ? AST 24 08/05/2021 1140  ? AST 24 11/12/2020 1026  ? ALT 22 08/05/2021 1140  ? ALT 17 11/12/2020 1026  ? ALKPHOS 58 08/05/2021 1140  ? ALKPHOS 75 11/12/2020 1026  ? BILITOT 1.9 (H) 08/05/2021 1140  ? BILITOT 0.7 11/12/2020 1026  ? PROT 7.7 08/05/2021 1140  ? PROT 7.3 11/12/2020 1026  ? ALBUMIN 4.1 08/05/2021 1140  ? ALBUMIN 4.0 11/12/2020 1026  ? ? ?Studies/Results: ?CT Abdomen Pelvis W Contrast ? ?Result Date: 08/05/2021 ?CLINICAL DATA:  Abdominal pain and diarrhea for several days. EXAM: CT ABDOMEN AND PELVIS WITH CONTRAST TECHNIQUE: Multidetector CT imaging of the abdomen and pelvis was performed using the standard protocol following bolus administration of intravenous contrast. RADIATION DOSE REDUCTION: This exam was performed according to the departmental dose-optimization program which includes automated exposure control, adjustment of the mA and/or kV according to patient size and/or use of iterative reconstruction technique. CONTRAST:  81m OMNIPAQUE IOHEXOL 300 MG/ML  SOLN COMPARISON:  None Available. FINDINGS: Lower Chest: No acute findings. Hepatobiliary: No hepatic masses identified. Gallbladder is unremarkable. No evidence of biliary ductal dilatation. Pancreas:  No mass or inflammatory changes. Spleen: Within normal limits in size and appearance. Adrenals/Urinary Tract: No masses identified. No evidence of ureteral calculi or hydronephrosis. Stomach/Bowel: Perforated appendicitis is seen, with rim enhancing fluid and gas collection in the periappendiceal region which measures 3.9 x 3.4 cm. This is consistent with periappendiceal abscess. Mild dilatation of small bowel loops is seen without transition point, consistent  with ileus. Vascular/Lymphatic: No pathologically enlarged lymph nodes. No acute vascular findings. Aortic atherosclerotic calcification noted. Reproductive:  No mass or other significant abnormality. Other:  None. Musculoskeletal:  No suspicious bone lesions identified. IMPRESSION: Perforated appendicitis, with 3.9 cm periappendiceal abscess. Ileus. Aortic Atherosclerosis (ICD10-I70.0). Electronically Signed   By: Marlaine Hind M.D.   On: 08/05/2021 12:50   ? ? ? ?Anita Donovan ?08/06/2021 ? ?  ?

## 2021-08-06 NOTE — Plan of Care (Signed)
  Problem: Education: Goal: Knowledge of General Education information will improve Description: Including pain rating scale, medication(s)/side effects and non-pharmacologic comfort measures Outcome: Progressing   Problem: Activity: Goal: Risk for activity intolerance will decrease Outcome: Progressing   Problem: Pain Managment: Goal: General experience of comfort will improve Outcome: Progressing   

## 2021-08-07 ENCOUNTER — Encounter (HOSPITAL_COMMUNITY): Payer: Self-pay

## 2021-08-07 ENCOUNTER — Inpatient Hospital Stay (HOSPITAL_COMMUNITY): Payer: Medicare HMO

## 2021-08-07 LAB — BASIC METABOLIC PANEL
Anion gap: 13 (ref 5–15)
BUN: 20 mg/dL (ref 8–23)
CO2: 15 mmol/L — ABNORMAL LOW (ref 22–32)
Calcium: 8.3 mg/dL — ABNORMAL LOW (ref 8.9–10.3)
Chloride: 107 mmol/L (ref 98–111)
Creatinine, Ser: 0.74 mg/dL (ref 0.44–1.00)
GFR, Estimated: >60 mL/min (ref >60)
Glucose, Bld: 88 mg/dL (ref 70–99)
Potassium: 2.9 mmol/L — ABNORMAL LOW (ref 3.5–5.1)
Sodium: 135 mmol/L (ref 135–145)

## 2021-08-07 LAB — CBC
HCT: 34.9 % — ABNORMAL LOW (ref 36.0–46.0)
Hemoglobin: 12.3 g/dL (ref 12.0–15.0)
MCH: 32.8 pg (ref 26.0–34.0)
MCHC: 35.2 g/dL (ref 30.0–36.0)
MCV: 93.1 fL (ref 80.0–100.0)
Platelets: 235 10*3/uL (ref 150–400)
RBC: 3.75 MIL/uL — ABNORMAL LOW (ref 3.87–5.11)
RDW: 12.6 % (ref 11.5–15.5)
WBC: 9.1 10*3/uL (ref 4.0–10.5)
nRBC: 0 % (ref 0.0–0.2)

## 2021-08-07 LAB — PROTIME-INR
INR: 1 (ref 0.8–1.2)
Prothrombin Time: 13.5 seconds (ref 11.4–15.2)

## 2021-08-07 LAB — MAGNESIUM: Magnesium: 2.3 mg/dL (ref 1.7–2.4)

## 2021-08-07 MED ORDER — IOHEXOL 300 MG/ML  SOLN
100.0000 mL | Freq: Once | INTRAMUSCULAR | Status: AC | PRN
  Administered 2021-08-07: 100 mL via INTRAVENOUS

## 2021-08-07 MED ORDER — SODIUM CHLORIDE (PF) 0.9 % IJ SOLN
INTRAMUSCULAR | Status: AC
  Filled 2021-08-07: qty 50

## 2021-08-07 MED ORDER — POTASSIUM CHLORIDE 10 MEQ/100ML IV SOLN
10.0000 meq | INTRAVENOUS | Status: AC
  Administered 2021-08-07 (×6): 10 meq via INTRAVENOUS
  Filled 2021-08-07 (×6): qty 100

## 2021-08-07 MED ORDER — IOHEXOL 9 MG/ML PO SOLN
ORAL | Status: AC
  Filled 2021-08-07: qty 1000

## 2021-08-07 MED ORDER — ENOXAPARIN SODIUM 40 MG/0.4ML IJ SOSY
40.0000 mg | PREFILLED_SYRINGE | INTRAMUSCULAR | Status: DC
  Administered 2021-08-08 – 2021-08-09 (×2): 40 mg via SUBCUTANEOUS
  Filled 2021-08-07 (×2): qty 0.4

## 2021-08-07 MED ORDER — IOHEXOL 9 MG/ML PO SOLN
500.0000 mL | ORAL | Status: AC
  Administered 2021-08-07 (×2): 500 mL via ORAL

## 2021-08-07 NOTE — Progress Notes (Signed)
[facility] ?Progress Note ? ?   ?Subjective: ?CC:  ?Overall her pain is less, worse with movement, but she does report increasing pelvic pressure. Denies nausea or vomiting. Having frequent loose, bilious stools.  ? ?Objective: ?Vital signs in last 24 hours: ?Temp:  [97.6 ?F (36.4 ?C)-98.9 ?F (37.2 ?C)] 98.9 ?F (37.2 ?C) (05/08 0522) ?Pulse Rate:  [93-99] 97 (05/08 0522) ?Resp:  [16] 16 (05/08 0522) ?BP: (104-115)/(58-70) 111/58 (05/08 0522) ?SpO2:  [96 %-99 %] 97 % (05/08 0522) ?Last BM Date : 08/06/21 ? ?Intake/Output from previous day: ?05/07 0701 - 05/08 0700 ?In: 2537.7 [P.O.:600; I.V.:1540.1; IV Piggyback:397.6] ?Out: 0  ?Intake/Output this shift: ?Total I/O ?In: 166.2 [I.V.:166.2] ?Out: -  ? ?PE: ?Gen:  Alert, NAD, pleasant ?Card:  Regular rate and rhythm, pedal pulses 2+ BL ?Pulm:  Normal effort ORA ?Abd: Soft, mild distention, very tender in the RLQ, mild tenderness suprapubic region, otherwise non-tender. No peritonitis.  ?Skin: warm and dry, no rashes  ?Psych: A&Ox3  ? ?Lab Results:  ?Recent Labs  ?  08/06/21 ?0428 08/07/21 ?0740  ?WBC 11.3* 9.1  ?HGB 12.1 12.3  ?HCT 36.5 34.9*  ?PLT 207 235  ? ?BMET ?Recent Labs  ?  08/06/21 ?0428 08/07/21 ?0740  ?NA 136 135  ?K 2.9* 2.9*  ?CL 107 107  ?CO2 18* 15*  ?GLUCOSE 104* 88  ?BUN 25* 20  ?CREATININE 0.77 0.74  ?CALCIUM 8.8* 8.3*  ? ?PT/INR ?Recent Labs  ?  08/07/21 ?0740  ?LABPROT 13.5  ?INR 1.0  ? ?CMP  ?   ?Component Value Date/Time  ? NA 135 08/07/2021 0740  ? NA 135 12/12/2020 1130  ? K 2.9 (L) 08/07/2021 0740  ? CL 107 08/07/2021 0740  ? CO2 15 (L) 08/07/2021 0740  ? GLUCOSE 88 08/07/2021 0740  ? BUN 20 08/07/2021 0740  ? BUN 17 12/12/2020 1130  ? CREATININE 0.74 08/07/2021 0740  ? CREATININE 0.79 02/12/2014 1521  ? CALCIUM 8.3 (L) 08/07/2021 0740  ? PROT 7.7 08/05/2021 1140  ? ALBUMIN 4.1 08/05/2021 1140  ? AST 24 08/05/2021 1140  ? ALT 22 08/05/2021 1140  ? ALKPHOS 58 08/05/2021 1140  ? BILITOT 1.9 (H) 08/05/2021 1140  ? GFRNONAA >60  08/07/2021 0740  ? GFRNONAA 79 02/12/2014 1521  ? GFRAA >89 02/12/2014 1521  ? ?Lipase  ?   ?Component Value Date/Time  ? LIPASE 23 08/05/2021 1140  ? ? ? ? ? ?Studies/Results: ?CT Abdomen Pelvis W Contrast ? ?Result Date: 08/05/2021 ?CLINICAL DATA:  Abdominal pain and diarrhea for several days. EXAM: CT ABDOMEN AND PELVIS WITH CONTRAST TECHNIQUE: Multidetector CT imaging of the abdomen and pelvis was performed using the standard protocol following bolus administration of intravenous contrast. RADIATION DOSE REDUCTION: This exam was performed according to the departmental dose-optimization program which includes automated exposure control, adjustment of the mA and/or kV according to patient size and/or use of iterative reconstruction technique. CONTRAST:  65m OMNIPAQUE IOHEXOL 300 MG/ML  SOLN COMPARISON:  None Available. FINDINGS: Lower Chest: No acute findings. Hepatobiliary: No hepatic masses identified. Gallbladder is unremarkable. No evidence of biliary ductal dilatation. Pancreas:  No mass or inflammatory changes. Spleen: Within normal limits in size and appearance. Adrenals/Urinary Tract: No masses identified. No evidence of ureteral calculi or hydronephrosis. Stomach/Bowel: Perforated appendicitis is seen, with rim enhancing fluid and gas collection in the periappendiceal region which measures 3.9 x 3.4 cm. This is consistent with periappendiceal abscess. Mild dilatation of small bowel loops is seen without transition point, consistent  with ileus. Vascular/Lymphatic: No pathologically enlarged lymph nodes. No acute vascular findings. Aortic atherosclerotic calcification noted. Reproductive:  No mass or other significant abnormality. Other:  None. Musculoskeletal:  No suspicious bone lesions identified. IMPRESSION: Perforated appendicitis, with 3.9 cm periappendiceal abscess. Ileus. Aortic Atherosclerosis (ICD10-I70.0). Electronically Signed   By: Marlaine Hind M.D.   On: 08/05/2021 12:50    ? ?Anti-infectives: ?Anti-infectives (From admission, onward)  ? ? Start     Dose/Rate Route Frequency Ordered Stop  ? 08/06/21 0200  metroNIDAZOLE (FLAGYL) IVPB 500 mg       ?See Hyperspace for full Linked Orders Report.  ? 500 mg ?100 mL/hr over 60 Minutes Intravenous Every 12 hours 08/05/21 1506 08/13/21 0159  ? 08/05/21 2300  cefTRIAXone (ROCEPHIN) 2 g in sodium chloride 0.9 % 100 mL IVPB       ?See Hyperspace for full Linked Orders Report.  ? 2 g ?200 mL/hr over 30 Minutes Intravenous Every 24 hours 08/05/21 1506 08/12/21 2259  ? 08/05/21 1300  ceFEPIme (MAXIPIME) 2 g in sodium chloride 0.9 % 100 mL IVPB       ?See Hyperspace for full Linked Orders Report.  ? 2 g ?200 mL/hr over 30 Minutes Intravenous  Once 08/05/21 1254 08/05/21 1338  ? 08/05/21 1300  metroNIDAZOLE (FLAGYL) IVPB 500 mg       ?See Hyperspace for full Linked Orders Report.  ? 500 mg ?100 mL/hr over 60 Minutes Intravenous  Once 08/05/21 1254 08/05/21 1448  ? ?  ? ? ? ?Assessment/Plan ? HD#3 - acute appendicitis with perforation, appendicoliths on anticoagulation ?            - hold Eliquis - last dose 5/6 AM ?            - IV Rocephin, Flagyl ?            - CT scan from admission reviewed with attending. Fluid collection does not look drainable. Patient is afebrile, leukocytosis resolved, not clinically worsening. She does have significant RLQ pain, diarrhea, and distention. Could consider repeat CT scan A/P today to evaluate evolution of abscess and possibility for perc drain. I do have concerns about appendicoliths and abscess not being amenable to drainage, she may just need laparoscopic appendectomy, possible open appendectomy or ileocecectomy. Dr. Marlou Starks to see the patient this morning and decide. Continue NPO, IV Abx. Plan of care discussed with patient as well as her friend and retired Engineer, drilling, Event organiser, over the phone.  ? ? LOS: 2 days  ? ?I reviewed nursing notes, last 24 h vitals and pain scores, last 48 h intake and output, last 24 h  labs and trends, and last 24 h imaging results. ? ? ?Obie Dredge, PA-C ?[facility] ?Please see Amion for pager number during day hours 7:00am-4:30pm ? ? ? ? ? ? ?

## 2021-08-07 NOTE — Progress Notes (Signed)
24 hour chart audit completed 

## 2021-08-07 NOTE — Plan of Care (Signed)
  Problem: Pain Managment: Goal: General experience of comfort will improve Outcome: Progressing   

## 2021-08-07 NOTE — H&P (Signed)
Chief Complaint: Patient was seen in consultation today for appendiceal abscess  at the request of Hosie Spangle, [place]  Referring Physician(s): Hosie Spangle, [place]  Supervising Physician: Richarda Overlie  Patient Status: [facility] - In-pt  History of Present Illness: Anita Donovan is a 71 y.o. female with past medical history of osteoporosis, paroxysmal A-fib on Eliquis, NSTEMI and SVT.  Patient presented to [facility] emergency department 08/05/2021 complaining of abdominal pain x3 days with diarrhea.  CT abdomen pelvis at that time showed perforated appendicitis with periappendiceal abscess and ileus.  Patient was admitted and treated nonsurgically with IV antibiotics.  Patient had repeat CT abdomen pelvis today that showed slight interval increase in size of periappendiceal abscess.  Patient was referred to IR by Hosie Spangle, PA, for possible percutaneous abscess drain placement.  Procedure was approved by Dr. Lowella Dandy.  CT abdomen pelvis 08/07/2021: IMPRESSION: 1. Slight interval increase in size of the periappendiceal abscess. 2. Worsened mild small bowel ileus, although oral contrast reaches the rectum. 3. Aortic Atherosclerosis (ICD10-I70.0).  Past Medical History:  Diagnosis Date   Allergy    Arthritis    knee right   Multiple thyroid nodules    Osteoporosis    Persistent atrial fibrillation (HCC) 12/20/2020   SVT (supraventricular tachycardia) (HCC)     Past Surgical History:  Procedure Laterality Date   CARDIOVERSION N/A 12/20/2020   Procedure: CARDIOVERSION;  Surgeon: Chilton Si, MD;  Location: [facility] ENDOSCOPY;  Service: Cardiovascular;  Laterality: N/A;   COLONOSCOPY     LEFT HEART CATH AND CORONARY ANGIOGRAPHY N/A 04/05/2020   Procedure: LEFT HEART CATH AND CORONARY ANGIOGRAPHY;  Surgeon: Lyn Records, MD;  Location: [facility] INVASIVE CV LAB;  Service: Cardiovascular;  Laterality: N/A;   POLYPECTOMY     TONSILLECTOMY      Allergies: Patient has no known  allergies.  Medications: Prior to Admission medications   Medication Sig Start Date End Date Taking? Authorizing Provider  alendronate (FOSAMAX) 70 MG tablet Take 1 tablet by mouth every Monday. 04/20/16  Yes [provider]  apixaban (ELIQUIS) 5 MG TABS tablet TAKE 1 TABLET(5 MG) BY MOUTH TWICE DAILY Patient taking differently: Take 5 mg by mouth 2 (two) times daily. 04/12/21  Yes Ronney Asters, NP  b complex vitamins capsule Take 1 capsule by mouth daily.   Yes [provider]  Calcium Carbonate-Vitamin D (CALTRATE 600+D PO) Take 1 capsule by mouth daily.   Yes [provider]  carvedilol (COREG) 6.25 MG tablet TAKE 1 TABLET(6.25 MG) BY MOUTH TWICE DAILY Patient taking differently: Take 6.25 mg by mouth 2 (two) times daily with a meal. TAKE 1 TABLET(6.25 MG) BY MOUTH TWICE DAILY 04/20/21  Yes Lewayne Bunting, MD  Fluticasone-Umeclidin-Vilant 100-62.5-25 MCG/INH AEPB Inhale 1 puff into the lungs daily. 11/22/20  Yes [provider]  levothyroxine (SYNTHROID) 50 MCG tablet Take 50 mcg by mouth daily before breakfast.   Yes [provider]  Milk Thistle 250 MG CAPS Take 250 mg by mouth 2 (two) times daily.   Yes [provider]  Multiple Vitamins-Minerals (CENTRUM SILVER ADULT 50+ PO) Take 1 tablet by mouth daily.   Yes [provider]  nitroGLYCERIN (NITROSTAT) 0.4 MG SL tablet Place 1 tablet (0.4 mg total) under the tongue every 5 (five) minutes as needed for chest pain. 04/05/20 08/05/21 Yes Kroeger, Ovidio Kin., PA-C  Omega-3 Fatty Acids (FISH OIL) 1200 MG CAPS Take 1 capsule by mouth daily.   Yes [provider]  Coenzyme Q10 (COQ10 PO) Take 10 mLs by mouth daily. Patient not taking: Reported on 08/05/2021    [provider]  metoprolol succinate (TOPROL-XL) 25 MG 24 hr tablet Take 25 mg by mouth daily as needed (rapid heart rate). Patient not taking: Reported on 08/05/2021    [provider]     Family  History  Problem Relation Age of Onset   Heart disease Mother        Stents   Colon cancer Neg Hx    Colon polyps Neg Hx    Esophageal cancer Neg Hx    Rectal cancer Neg Hx    Stomach cancer Neg Hx     Social History   Socioeconomic History   Marital status: Widowed    Spouse name: Not on file   Number of children: Not on file   Years of education: Not on file   Highest education level: Not on file  Occupational History   Not on file  Tobacco Use   Smoking status: Former    Types: Cigarettes    Quit date: 04/08/2017    Years since quitting: 4.3   Smokeless tobacco: Never  Substance and Sexual Activity   Alcohol use: Yes    Alcohol/week: 0.0 standard drinks    Comment: 2 glasses per night   Drug use: Never   Sexual activity: Not on file  Other Topics Concern   Not on file  Social History Narrative   Not on file   Social Determinants of Health   Financial Resource Strain: Not on file  Food Insecurity: Not on file  Transportation Needs: Not on file  Physical Activity: Not on file  Stress: Not on file  Social Connections: Not on file   Review of Systems: A 12 point ROS discussed and pertinent positives are indicated in the HPI above.  All other systems are negative.  Review of Systems  Constitutional:  Negative for fever.  Respiratory:  Negative for shortness of breath.   Cardiovascular:  Negative for chest pain.  Gastrointestinal:  Positive for abdominal pain.   Vital Signs: BP (!) 111/58 (BP Location: Left Arm)   Pulse 97   Temp 98.9 F (37.2 C)   Resp 16   Ht 5\' 7"  (1.702 m)   Wt 116 lb (52.6 kg)   SpO2 98%   BMI 18.17 kg/m   Physical Exam Vitals reviewed.  Constitutional:      General: She is not in acute distress.    Appearance: Normal appearance. She is not ill-appearing.  HENT:     Head: Normocephalic and atraumatic.  Eyes:     Extraocular Movements: Extraocular movements intact.     Pupils: Pupils are equal, round, and reactive to light.   Cardiovascular:     Rate and Rhythm: Normal rate. Rhythm irregular.  Abdominal:     General: Bowel sounds are normal. There is distension.     Tenderness: There is abdominal tenderness.  Musculoskeletal:     Right lower leg: No edema.     Left lower leg: No edema.  Skin:    General: Skin is warm and dry.  Neurological:     Mental Status: She is alert and oriented to person, place, and time.  Psychiatric:        Mood and Affect: Mood normal.        Behavior: Behavior normal.        Thought Content: Thought content normal.        Judgment: Judgment  normal.    Imaging: CT ABDOMEN PELVIS W CONTRAST  Result Date: 08/07/2021 CLINICAL DATA:  Perforated appendicitis follow-up. EXAM: CT ABDOMEN AND PELVIS WITH CONTRAST TECHNIQUE: Multidetector CT imaging of the abdomen and pelvis was performed using the standard protocol following bolus administration of intravenous contrast. RADIATION DOSE REDUCTION: This exam was performed according to the departmental dose-optimization program which includes automated exposure control, adjustment of the mA and/or kV according to patient size and/or use of iterative reconstruction technique. CONTRAST:  OMNIPAQUE IOHEXOL 300 MG/ML  SOLN COMPARISON:  CT abdomen pelvis dated Aug 05, 2021. FINDINGS: Lower chest: No acute abnormality. Hepatobiliary: No focal liver abnormality is seen. No gallstones, gallbladder wall thickening, or biliary dilatation. Pancreas: Unremarkable. No pancreatic ductal dilatation or surrounding inflammatory changes. Spleen: Normal in size without focal abnormality. Adrenals/Urinary Tract: Adrenal glands are unremarkable. Kidneys are normal, without renal calculi, focal lesion, or hydronephrosis. Bladder is unremarkable. Stomach/Bowel: Periappendiceal abscess has slightly increased in size since the prior study, currently measuring 5.3 x 3.9 x 6.9 cm, previously 4.9 x 3.6 x 6.9 cm (when remeasured). Three appendicoliths again noted. Worsened  mild small bowel dilatation without transition point. Oral contrast reaches the rectum. Unchanged small hiatal hernia. The stomach is otherwise within normal limits. Vascular/Lymphatic: Aortic atherosclerosis. No enlarged abdominal or pelvic lymph nodes. Reproductive: Status post hysterectomy. No adnexal masses. Other: Trace free fluid in the pelvis is slightly increased. No pneumoperitoneum. Musculoskeletal: No acute or significant osseous findings. New subcutaneous foci of air in the left anterior abdominal wall, likely related to injections. IMPRESSION: 1. Slight interval increase in size of the periappendiceal abscess. 2. Worsened mild small bowel ileus, although oral contrast reaches the rectum. 3. Aortic Atherosclerosis (ICD10-I70.0). Electronically Signed   By: Obie Dredge M.D.   On: 08/07/2021 15:17   CT Abdomen Pelvis W Contrast  Result Date: 08/05/2021 CLINICAL DATA:  Abdominal pain and diarrhea for several days. EXAM: CT ABDOMEN AND PELVIS WITH CONTRAST TECHNIQUE: Multidetector CT imaging of the abdomen and pelvis was performed using the standard protocol following bolus administration of intravenous contrast. RADIATION DOSE REDUCTION: This exam was performed according to the departmental dose-optimization program which includes automated exposure control, adjustment of the mA and/or kV according to patient size and/or use of iterative reconstruction technique. CONTRAST:  80mL OMNIPAQUE IOHEXOL 300 MG/ML  SOLN COMPARISON:  None Available. FINDINGS: Lower Chest: No acute findings. Hepatobiliary: No hepatic masses identified. Gallbladder is unremarkable. No evidence of biliary ductal dilatation. Pancreas:  No mass or inflammatory changes. Spleen: Within normal limits in size and appearance. Adrenals/Urinary Tract: No masses identified. No evidence of ureteral calculi or hydronephrosis. Stomach/Bowel: Perforated appendicitis is seen, with rim enhancing fluid and gas collection in the periappendiceal  region which measures 3.9 x 3.4 cm. This is consistent with periappendiceal abscess. Mild dilatation of small bowel loops is seen without transition point, consistent with ileus. Vascular/Lymphatic: No pathologically enlarged lymph nodes. No acute vascular findings. Aortic atherosclerotic calcification noted. Reproductive:  No mass or other significant abnormality. Other:  None. Musculoskeletal:  No suspicious bone lesions identified. IMPRESSION: Perforated appendicitis, with 3.9 cm periappendiceal abscess. Ileus. Aortic Atherosclerosis (ICD10-I70.0). Electronically Signed   By: Danae Orleans M.D.   On: 08/05/2021 12:50    Labs:  CBC: Recent Labs    12/12/20 1130 08/05/21 1140 08/06/21 0428 08/07/21 0740  WBC 5.6 13.4* 11.3* 9.1  HGB 13.1 13.1 12.1 12.3  HCT 39.3 38.9 36.5 34.9*  PLT 244 189 207 235    COAGS: Recent Labs  08/07/21 0740  INR 1.0    BMP: Recent Labs    11/12/20 1026 12/12/20 1130 08/05/21 1140 08/06/21 0428 08/07/21 0740  NA 134* 135 132* 136 135  K 4.2 4.9 3.5 2.9* 2.9*  CL 97* 99 100 107 107  CO2 25 27 18* 18* 15*  GLUCOSE 115* 66 132* 104* 88  BUN 15 17 27* 25* 20  CALCIUM 9.2 9.7 9.2 8.8* 8.3*  CREATININE 0.68 0.68 1.14* 0.77 0.74  GFRNONAA >60  --  51* >60 >60    LIVER FUNCTION TESTS: Recent Labs    11/12/20 1026 08/05/21 1140  BILITOT 0.7 1.9*  AST 24 24  ALT 17 22  ALKPHOS 75 58  PROT 7.3 7.7  ALBUMIN 4.0 4.1    TUMOR MARKERS: No results for input(s): AFPTM, CEA, CA199, CHROMGRNA in the last 8760 hours.  Assessment and Plan: History of osteoporosis, paroxysmal A-fib on Eliquis, NSTEMI and SVT.  Patient presented to [facility] emergency department 08/05/2021 complaining of abdominal pain x3 days with diarrhea.  CT abdomen pelvis at that time showed perforated appendicitis with periappendiceal abscess and ileus.  Patient was admitted and treated nonsurgically with IV antibiotics.  Patient had repeat CT abdomen pelvis today that showed  slight interval increase in size of periappendiceal abscess.  Patient was referred to IR by Hosie Spangle, PA, for possible percutaneous abscess drain placement.  Procedure was approved by Dr. Lowella Dandy.  CT abdomen pelvis 08/07/2021: IMPRESSION: 1. Slight interval increase in size of the periappendiceal abscess. 2. Worsened mild small bowel ileus, although oral contrast reaches the rectum. 3. Aortic Atherosclerosis (ICD10-I70.0). Risks and benefits discussed with the patient including bleeding, infection, damage to adjacent structures, bowel perforation/fistula connection, and sepsis.  Patient resting in bed.  Pt friend at bedside. She is alert and oriented, calm and pleasant. She is in no distress. N.p.o. order placed. Patient is afebrile without leukocytosis. Last Eliquis 08/05/2021.   All of the patient's questions were answered, patient is agreeable to proceed. Consent signed and in chart.   Thank you for this interesting consult.  I greatly enjoyed meeting CYDNIE DEASON and look forward to participating in their care.  A copy of this report was sent to the requesting provider on this date.  Electronically Signed: Shon Hough, NP 08/07/2021, 4:08 PM   I spent a total of 20 minutes in face to face in clinical consultation, greater than 50% of which was counseling/coordinating care for periappendiceal abscess.

## 2021-08-08 ENCOUNTER — Inpatient Hospital Stay (HOSPITAL_COMMUNITY): Payer: Medicare HMO

## 2021-08-08 LAB — CBC
HCT: 37 % (ref 36.0–46.0)
Hemoglobin: 12.2 g/dL (ref 12.0–15.0)
MCH: 31.5 pg (ref 26.0–34.0)
MCHC: 33 g/dL (ref 30.0–36.0)
MCV: 95.6 fL (ref 80.0–100.0)
Platelets: 250 10*3/uL (ref 150–400)
RBC: 3.87 MIL/uL (ref 3.87–5.11)
RDW: 12.6 % (ref 11.5–15.5)
WBC: 9.2 10*3/uL (ref 4.0–10.5)
nRBC: 0 % (ref 0.0–0.2)

## 2021-08-08 LAB — BASIC METABOLIC PANEL
Anion gap: 12 (ref 5–15)
BUN: 14 mg/dL (ref 8–23)
CO2: 12 mmol/L — ABNORMAL LOW (ref 22–32)
Calcium: 8.2 mg/dL — ABNORMAL LOW (ref 8.9–10.3)
Chloride: 110 mmol/L (ref 98–111)
Creatinine, Ser: 0.63 mg/dL (ref 0.44–1.00)
GFR, Estimated: >60 mL/min (ref >60)
Glucose, Bld: 82 mg/dL (ref 70–99)
Potassium: 3.5 mmol/L (ref 3.5–5.1)
Sodium: 134 mmol/L — ABNORMAL LOW (ref 135–145)

## 2021-08-08 MED ORDER — LIDOCAINE HCL 1 % IJ SOLN
INTRAMUSCULAR | Status: AC | PRN
  Administered 2021-08-08: 10 mL via INTRADERMAL

## 2021-08-08 MED ORDER — FENTANYL CITRATE (PF) 100 MCG/2ML IJ SOLN
INTRAMUSCULAR | Status: AC | PRN
  Administered 2021-08-08: 25 ug via INTRAVENOUS

## 2021-08-08 MED ORDER — NALOXONE HCL 0.4 MG/ML IJ SOLN
INTRAMUSCULAR | Status: AC
  Filled 2021-08-08: qty 1

## 2021-08-08 MED ORDER — SODIUM CHLORIDE 0.9 % IV SOLN
INTRAVENOUS | Status: AC
  Filled 2021-08-08: qty 250

## 2021-08-08 MED ORDER — MIDAZOLAM HCL 2 MG/2ML IJ SOLN
INTRAMUSCULAR | Status: AC
  Filled 2021-08-08: qty 4

## 2021-08-08 MED ORDER — FENTANYL CITRATE (PF) 100 MCG/2ML IJ SOLN
INTRAMUSCULAR | Status: AC
  Filled 2021-08-08: qty 4

## 2021-08-08 MED ORDER — FLUMAZENIL 0.5 MG/5ML IV SOLN
INTRAVENOUS | Status: AC
  Filled 2021-08-08: qty 5

## 2021-08-08 MED ORDER — ENSURE ENLIVE PO LIQD
237.0000 mL | Freq: Two times a day (BID) | ORAL | Status: DC
  Administered 2021-08-09 – 2021-08-11 (×5): 237 mL via ORAL

## 2021-08-08 MED ORDER — MIDAZOLAM HCL 2 MG/2ML IJ SOLN
INTRAMUSCULAR | Status: AC | PRN
  Administered 2021-08-08: .5 mg via INTRAVENOUS

## 2021-08-08 NOTE — Care Management Important Message (Signed)
Important Message ? ?Patient Details IM Letter placed in Patients room. ?Name: Anita Donovan ?MRN: [id number] ?Date of Birth: [birth year] ? ? ?Medicare Important Message Given:  Yes ? ? ? ? ?Kerin Salen ?08/08/2021, 1:54 PM ?

## 2021-08-08 NOTE — Procedures (Signed)
Interventional Radiology Procedure: ? ? ?Indications: Perforated appendicitis with abscess ? ?Procedure: CT guided placement of periappendiceal drain ? ?Findings: 10 Fr drain placed in periappendiceal abscess.  Aspirated 12 ml of brown foul smelling fluid. ? ?Complications: No immediate complications noted. ?    ?EBL: Minimal ? ?Plan: Follow output and send fluid for culture.  ? ? ?Anita Donovan R. Anselm Pancoast, MD  ?Pager: [phone] ? ? ? ?  ?

## 2021-08-08 NOTE — Progress Notes (Signed)
[facility] ?Progress Note ? ?   ?Subjective: ?CC:  ?Continues to feel better - less pain, no nausea, vomiting. Having frequent loose stools. States she feels very hungry. ? ?Objective: ?Vital signs in last 24 hours: ?Temp:  [98.3 ?F (36.8 ?C)-98.5 ?F (36.9 ?C)] 98.5 ?F (36.9 ?C) (05/09 0544) ?Pulse Rate:  [95-105] 95 (05/09 0544) ?Resp:  [16] 16 (05/09 0544) ?BP: (115-121)/(77) 121/77 (05/09 0544) ?SpO2:  [98 %-99 %] 98 % (05/09 0744) ?Last BM Date : 08/07/21 ? ?Intake/Output from previous day: ?05/08 0701 - 05/09 0700 ?In: 2121.4 [P.O.:180; I.V.:1411.7; IV Piggyback:529.7] ?Out: -  ?Intake/Output this shift: ?No intake/output data recorded. ? ?PE: ?Gen:  Alert, NAD, pleasant ?Card:  Regular rate and rhythm, pedal pulses 2+ BL ?Pulm:  Normal effort ORA ?Abd: Soft, mild distention, mild RLQ - improved compared to yesterday. No rebound/guarding. No organomegaly. ?Skin: warm and dry, no rashes  ?Psych: A&Ox3  ? ?Lab Results:  ?Recent Labs  ?  08/07/21 ?0740 08/08/21 ?7412  ?WBC 9.1 9.2  ?HGB 12.3 12.2  ?HCT 34.9* 37.0  ?PLT 235 250  ? ?BMET ?Recent Labs  ?  08/07/21 ?0740 08/08/21 ?0328  ?NA 135 134*  ?K 2.9* 3.5  ?CL 107 110  ?CO2 15* 12*  ?GLUCOSE 88 82  ?BUN 20 14  ?CREATININE 0.74 0.63  ?CALCIUM 8.3* 8.2*  ? ?PT/INR ?Recent Labs  ?  08/07/21 ?0740  ?LABPROT 13.5  ?INR 1.0  ? ?CMP  ?   ?Component Value Date/Time  ? NA 134 (L) 08/08/2021 0328  ? NA 135 12/12/2020 1130  ? K 3.5 08/08/2021 0328  ? CL 110 08/08/2021 0328  ? CO2 12 (L) 08/08/2021 0328  ? GLUCOSE 82 08/08/2021 0328  ? BUN 14 08/08/2021 0328  ? BUN 17 12/12/2020 1130  ? CREATININE 0.63 08/08/2021 0328  ? CREATININE 0.79 02/12/2014 1521  ? CALCIUM 8.2 (L) 08/08/2021 0328  ? PROT 7.7 08/05/2021 1140  ? ALBUMIN 4.1 08/05/2021 1140  ? AST 24 08/05/2021 1140  ? ALT 22 08/05/2021 1140  ? ALKPHOS 58 08/05/2021 1140  ? BILITOT 1.9 (H) 08/05/2021 1140  ? GFRNONAA >60 08/08/2021 0328  ? GFRNONAA 79 02/12/2014 1521  ? GFRAA >89 02/12/2014 1521   ? ?Lipase  ?   ?Component Value Date/Time  ? LIPASE 23 08/05/2021 1140  ? ? ? ? ? ?Studies/Results: ?CT ABDOMEN PELVIS W CONTRAST ? ?Result Date: 08/07/2021 ?CLINICAL DATA:  Perforated appendicitis follow-up. EXAM: CT ABDOMEN AND PELVIS WITH CONTRAST TECHNIQUE: Multidetector CT imaging of the abdomen and pelvis was performed using the standard protocol following bolus administration of intravenous contrast. RADIATION DOSE REDUCTION: This exam was performed according to the departmental dose-optimization program which includes automated exposure control, adjustment of the mA and/or kV according to patient size and/or use of iterative reconstruction technique. CONTRAST:  147m OMNIPAQUE IOHEXOL 300 MG/ML  SOLN COMPARISON:  CT abdomen pelvis dated Aug 05, 2021. FINDINGS: Lower chest: No acute abnormality. Hepatobiliary: No focal liver abnormality is seen. No gallstones, gallbladder wall thickening, or biliary dilatation. Pancreas: Unremarkable. No pancreatic ductal dilatation or surrounding inflammatory changes. Spleen: Normal in size without focal abnormality. Adrenals/Urinary Tract: Adrenal glands are unremarkable. Kidneys are normal, without renal calculi, focal lesion, or hydronephrosis. Bladder is unremarkable. Stomach/Bowel: Periappendiceal abscess has slightly increased in size since the prior study, currently measuring 5.3 x 3.9 x 6.9 cm, previously 4.9 x 3.6 x 6.9 cm (when remeasured). Three appendicoliths again noted. Worsened mild small bowel dilatation without transition point. Oral  contrast reaches the rectum. Unchanged small hiatal hernia. The stomach is otherwise within normal limits. Vascular/Lymphatic: Aortic atherosclerosis. No enlarged abdominal or pelvic lymph nodes. Reproductive: Status post hysterectomy. No adnexal masses. Other: Trace free fluid in the pelvis is slightly increased. No pneumoperitoneum. Musculoskeletal: No acute or significant osseous findings. New subcutaneous foci of air in the  left anterior abdominal wall, likely related to injections. IMPRESSION: 1. Slight interval increase in size of the periappendiceal abscess. 2. Worsened mild small bowel ileus, although oral contrast reaches the rectum. 3. Aortic Atherosclerosis (ICD10-I70.0). Electronically Signed   By: Titus Dubin M.D.   On: 08/07/2021 15:17   ? ?Anti-infectives: ?Anti-infectives (From admission, onward)  ? ? Start     Dose/Rate Route Frequency Ordered Stop  ? 08/06/21 0200  metroNIDAZOLE (FLAGYL) IVPB 500 mg       ?See Hyperspace for full Linked Orders Report.  ? 500 mg ?100 mL/hr over 60 Minutes Intravenous Every 12 hours 08/05/21 1506 08/13/21 0159  ? 08/05/21 2300  cefTRIAXone (ROCEPHIN) 2 g in sodium chloride 0.9 % 100 mL IVPB       ?See Hyperspace for full Linked Orders Report.  ? 2 g ?200 mL/hr over 30 Minutes Intravenous Every 24 hours 08/05/21 1506 08/12/21 2259  ? 08/05/21 1300  ceFEPIme (MAXIPIME) 2 g in sodium chloride 0.9 % 100 mL IVPB       ?See Hyperspace for full Linked Orders Report.  ? 2 g ?200 mL/hr over 30 Minutes Intravenous  Once 08/05/21 1254 08/05/21 1338  ? 08/05/21 1300  metroNIDAZOLE (FLAGYL) IVPB 500 mg       ?See Hyperspace for full Linked Orders Report.  ? 500 mg ?100 mL/hr over 60 Minutes Intravenous  Once 08/05/21 1254 08/05/21 1448  ? ?  ? ? ? ?Assessment/Plan ? HD#4 - acute appendicitis with perforation, appendicoliths on anticoagulation ?            - hold Eliquis - last dose 5/6 AM ?            - IV Rocephin, Flagyl ?            - Repeat CT scan 5/8 w/ interval increase in periappendiceal abscess, IR to attempt perc drain today.  ? - clinically she is improving. Less pain, +flatus and loose stools, no nausea/vomiting. Plan to trial a FLD after procedure.  ? ?FEN: NPO for IR ?ID: Rocephin/Flagyl  ?VTE: SCD's, lovenox held for porcedure ?Foley: none ?Dispo: med-surg ? ? LOS: 3 days  ? ?I reviewed nursing notes, last 24 h vitals and pain scores, last 48 h intake and output, last 24 h labs and  trends, and last 24 h imaging results. ? ? ?Obie Dredge, PA-C ?[facility] ?Please see Amion for pager number during day hours 7:00am-4:30pm ? ? ? ? ? ? ?

## 2021-08-09 ENCOUNTER — Other Ambulatory Visit: Payer: Self-pay | Admitting: Physician Assistant

## 2021-08-09 DIAGNOSIS — K3532 Acute appendicitis with perforation and localized peritonitis, without abscess: Secondary | ICD-10-CM

## 2021-08-09 LAB — BASIC METABOLIC PANEL
Anion gap: 10 (ref 5–15)
BUN: 11 mg/dL (ref 8–23)
CO2: 14 mmol/L — ABNORMAL LOW (ref 22–32)
Calcium: 8 mg/dL — ABNORMAL LOW (ref 8.9–10.3)
Chloride: 110 mmol/L (ref 98–111)
Creatinine, Ser: 0.5 mg/dL (ref 0.44–1.00)
GFR, Estimated: >60 mL/min (ref >60)
Glucose, Bld: 99 mg/dL (ref 70–99)
Potassium: 3.4 mmol/L — ABNORMAL LOW (ref 3.5–5.1)
Sodium: 134 mmol/L — ABNORMAL LOW (ref 135–145)

## 2021-08-09 MED ORDER — POTASSIUM CHLORIDE CRYS ER 20 MEQ PO TBCR
40.0000 meq | EXTENDED_RELEASE_TABLET | Freq: Once | ORAL | Status: AC
  Administered 2021-08-09: 40 meq via ORAL
  Filled 2021-08-09: qty 2

## 2021-08-09 MED ORDER — SODIUM CHLORIDE 0.9% FLUSH
5.0000 mL | Freq: Three times a day (TID) | INTRAVENOUS | Status: DC
  Administered 2021-08-09 – 2021-08-11 (×5): 5 mL

## 2021-08-09 NOTE — Progress Notes (Signed)
? ? ?Referring Physician(s): ?Autumn Messing ? ?Supervising Physician: Arne Cleveland ? ?Patient Status:  [facility] - In-pt ? ?Chief Complaint: ? ?F/U drain ? ?Brief History: ? ?Anita Donovan is a 71 y.o. female with past medical history of osteoporosis, paroxysmal A-fib on Eliquis, NSTEMI and SVT.   ? ?She presented to [facility] emergency department 08/05/2021 complaining of abdominal pain x3 days with diarrhea.   ? ?CT abdomen pelvis at that time showed perforated appendicitis with periappendiceal abscess and ileus.   ?Patient was admitted and treated nonsurgically with IV antibiotics.   ? ?Repeat CT abdomen pelvis 08/07/21 that showed slight interval increase in size of periappendiceal abscess.   ? ?She underwent drain placement by Dr. Anselm Pancoast yesterday. ? ?Subjective: ? ?No complaints currently. Had previously c/o pain at the drain site but seems to be improved. ? ?Allergies: ?Patient has no known allergies. ? ?Medications: ?Prior to Admission medications   ?Medication Sig Start Date End Date Taking? Authorizing Provider  ?alendronate (FOSAMAX) 70 MG tablet Take 1 tablet by mouth every Monday. 04/20/16  Yes [provider]  ?apixaban (ELIQUIS) 5 MG TABS tablet TAKE 1 TABLET(5 MG) BY MOUTH TWICE DAILY ?Patient taking differently: Take 5 mg by mouth 2 (two) times daily. 04/12/21  Yes Deberah Pelton, NP  ?b complex vitamins capsule Take 1 capsule by mouth daily.   Yes [provider]  ?Calcium Carbonate-Vitamin D (CALTRATE 600+D PO) Take 1 capsule by mouth daily.   Yes [provider]  ?carvedilol (COREG) 6.25 MG tablet TAKE 1 TABLET(6.25 MG) BY MOUTH TWICE DAILY ?Patient taking differently: Take 6.25 mg by mouth 2 (two) times daily with a meal. TAKE 1 TABLET(6.25 MG) BY MOUTH TWICE DAILY 04/20/21  Yes Lelon Perla, MD  ?Fluticasone-Umeclidin-Vilant 100-62.5-25 MCG/INH AEPB Inhale 1 puff into the lungs daily. 11/22/20  Yes [provider]  ?levothyroxine (SYNTHROID) 50 MCG tablet Take 50  mcg by mouth daily before breakfast.   Yes [provider]  ?Milk Thistle 250 MG CAPS Take 250 mg by mouth 2 (two) times daily.   Yes [provider]  ?Multiple Vitamins-Minerals (CENTRUM SILVER ADULT 50+ PO) Take 1 tablet by mouth daily.   Yes [provider]  ?nitroGLYCERIN (NITROSTAT) 0.4 MG SL tablet Place 1 tablet (0.4 mg total) under the tongue every 5 (five) minutes as needed for chest pain. 04/05/20 08/05/21 Yes Kroeger, Lorelee Cover., PA-C  ?Omega-3 Fatty Acids (FISH OIL) 1200 MG CAPS Take 1 capsule by mouth daily.   Yes [provider]  ?Coenzyme Q10 (COQ10 PO) Take 10 mLs by mouth daily. ?Patient not taking: Reported on 08/05/2021    [provider]  ?metoprolol succinate (TOPROL-XL) 25 MG 24 hr tablet Take 25 mg by mouth daily as needed (rapid heart rate). ?Patient not taking: Reported on 08/05/2021    [provider]  ? ? ? ?Vital Signs: ?BP (!) 96/57 (BP Location: Right Arm)   Pulse 97   Temp 97.7 ?F (36.5 ?C) (Oral)   Resp 18   Ht '5\' 7"'$  (1.702 m)   Wt 116 lb (52.6 kg)   SpO2 98%   BMI 18.17 kg/m?  ? ?Physical Exam ?Vitals reviewed.  ?Constitutional:   ?   Appearance: Normal appearance.  ?Cardiovascular:  ?   Rate and Rhythm: Normal rate.  ?Pulmonary:  ?   Effort: Pulmonary effort is normal. No respiratory distress.  ?Abdominal:  ?   Comments: Drain Location: RUQ ?Size: Fr size: 10 Fr ?Date of placement:  08/08/21  ?Currently to: Drain collection device: suction bulb ?24 hour output:  ?Current examination: ?Flushes/aspirates easily.  ?Insertion site unremarkable. ?Suture and stat lock in place. ?Dressed appropriately.  ?  ?Neurological:  ?   Mental Status: She is alert.  ? ? ?Imaging: ?CT ABDOMEN PELVIS W CONTRAST ? ?Result Date: 08/07/2021 ?CLINICAL DATA:  Perforated appendicitis follow-up. EXAM: CT ABDOMEN AND PELVIS WITH CONTRAST TECHNIQUE: Multidetector CT imaging of the abdomen and pelvis was performed using the standard protocol following bolus  administration of intravenous contrast. RADIATION DOSE REDUCTION: This exam was performed according to the departmental dose-optimization program which includes automated exposure control, adjustment of the mA and/or kV according to patient size and/or use of iterative reconstruction technique. CONTRAST:  168m OMNIPAQUE IOHEXOL 300 MG/ML  SOLN COMPARISON:  CT abdomen pelvis dated Aug 05, 2021. FINDINGS: Lower chest: No acute abnormality. Hepatobiliary: No focal liver abnormality is seen. No gallstones, gallbladder wall thickening, or biliary dilatation. Pancreas: Unremarkable. No pancreatic ductal dilatation or surrounding inflammatory changes. Spleen: Normal in size without focal abnormality. Adrenals/Urinary Tract: Adrenal glands are unremarkable. Kidneys are normal, without renal calculi, focal lesion, or hydronephrosis. Bladder is unremarkable. Stomach/Bowel: Periappendiceal abscess has slightly increased in size since the prior study, currently measuring 5.3 x 3.9 x 6.9 cm, previously 4.9 x 3.6 x 6.9 cm (when remeasured). Three appendicoliths again noted. Worsened mild small bowel dilatation without transition point. Oral contrast reaches the rectum. Unchanged small hiatal hernia. The stomach is otherwise within normal limits. Vascular/Lymphatic: Aortic atherosclerosis. No enlarged abdominal or pelvic lymph nodes. Reproductive: Status post hysterectomy. No adnexal masses. Other: Trace free fluid in the pelvis is slightly increased. No pneumoperitoneum. Musculoskeletal: No acute or significant osseous findings. New subcutaneous foci of air in the left anterior abdominal wall, likely related to injections. IMPRESSION: 1. Slight interval increase in size of the periappendiceal abscess. 2. Worsened mild small bowel ileus, although oral contrast reaches the rectum. 3. Aortic Atherosclerosis (ICD10-I70.0). Electronically Signed   By: WTitus DubinM.D.   On: 08/07/2021 15:17  ? ?CT IMAGE GUIDED DRAINAGE BY  PERCUTANEOUS CATHETER ? ?Result Date: 08/08/2021 ?INDICATION: 71year old with perforated appendicitis with periappendiceal abscess. EXAM: CT-GUIDED DRAIN PLACEMENT IN PERIAPPENDICEAL ABSCESS MEDICATIONS: Moderate sedation ANESTHESIA/SEDATION: Moderate (conscious) sedation was employed during this procedure. A total of Versed 0.'5mg'$  and fentanyl 25 mcg was administered intravenously at the order of the provider performing the procedure. Total intra-service moderate sedation time: 35 minutes. Patient's level of consciousness and vital signs were monitored continuously by radiology nurse throughout the procedure under the supervision of the provider performing the procedure. COMPLICATIONS: None immediate. PROCEDURE: Informed written consent was obtained from the patient after a thorough discussion of the procedural risks, benefits and alternatives. All questions were addresseda timeout was performed prior to the initiation of the procedure. Patient was placed supine on the CT scanner. Patient was repositioned with the right side more elevated. Follow up CT images were obtained. Percutaneous window between the right colon and the right iliac vessels was identified. The right lower quadrant of the abdomen was prepped with chlorhexidine and sterile field was created. Maximal barrier sterile technique was utilized including caps, mask, sterile gowns, sterile gloves, sterile drape, hand hygiene and skin antiseptic. Skin was anesthetized with 1% lidocaine. A small incision was made. Using CT guidance, an 18 gauge trocar needle was directed into the periappendiceal abscess between the right iliac vessels and the right colon. Brown foul-smelling fluid was aspirated from the needle and a superstiff Amplatz wire was advanced  in the collection. Follow up CT images were obtained. The tract was dilated to accommodate a 10 [place] multipurpose drain. 12 mL of additional brown fluid was aspirated and follow up CT images were obtained.  The drain was sutured to skin and attached to a suction bulb. Fluid sample was sent for culture. Bandage was placed. RADIATION DOSE REDUCTION: This exam was performed according to the departmental dose-optimization program whi

## 2021-08-09 NOTE — Plan of Care (Signed)
  Problem: Pain Managment: Goal: General experience of comfort will improve Outcome: Progressing   Problem: Coping: Goal: Level of anxiety will decrease Outcome: Progressing   

## 2021-08-09 NOTE — Progress Notes (Signed)
[facility] ?Progress Note ? ?   ?Subjective: ?CC:  ?having pain around drain, +flatus and loose stools, no nausea/vomiting. Decreased distention.  ? ?Objective: ?Vital signs in last 24 hours: ?Temp:  [97.7 ?F (36.5 ?C)-98.2 ?F (36.8 ?C)] 97.7 ?F (36.5 ?C) (05/10 1004) ?Pulse Rate:  [83-105] 97 (05/10 1004) ?Resp:  [16-21] 18 (05/10 1004) ?BP: (85-120)/(51-75) 96/57 (05/10 1004) ?SpO2:  [97 %-100 %] 98 % (05/10 1004) ?Last BM Date : 08/07/21 ? ?Intake/Output from previous day: ?05/09 0701 - 05/10 0700 ?In: 2454.6 [P.O.:540; I.V.:1514.5; IV Piggyback:400] ?Out: 45 [Drains:39] ?Intake/Output this shift: ?Total I/O ?In: 100 [IV Piggyback:100] ?Out: -  ? ?PE: ?Gen:  Alert, NAD, pleasant ?Card:  Regular rate and rhythm, pedal pulses 2+ BL ?Pulm:  Normal effort ORA ?Abd: Soft, mild distention improved compared to previous exam, RLQ JP with purulent and sanguinous fluid. Appropriately tender. No peritonitis  ?Skin: warm and dry, no rashes  ?Psych: A&Ox3  ? ?Lab Results:  ?Recent Labs  ?  08/07/21 ?0740 08/08/21 ?4235  ?WBC 9.1 9.2  ?HGB 12.3 12.2  ?HCT 34.9* 37.0  ?PLT 235 250  ? ?BMET ?Recent Labs  ?  08/08/21 ?0328 08/09/21 ?0823  ?NA 134* 134*  ?K 3.5 3.4*  ?CL 110 110  ?CO2 12* 14*  ?GLUCOSE 82 99  ?BUN 14 11  ?CREATININE 0.63 0.50  ?CALCIUM 8.2* 8.0*  ? ?PT/INR ?Recent Labs  ?  08/07/21 ?0740  ?LABPROT 13.5  ?INR 1.0  ? ?CMP  ?   ?Component Value Date/Time  ? NA 134 (L) 08/09/2021 3614  ? NA 135 12/12/2020 1130  ? K 3.4 (L) 08/09/2021 4315  ? CL 110 08/09/2021 0823  ? CO2 14 (L) 08/09/2021 0823  ? GLUCOSE 99 08/09/2021 0823  ? BUN 11 08/09/2021 0823  ? BUN 17 12/12/2020 1130  ? CREATININE 0.50 08/09/2021 0823  ? CREATININE 0.79 02/12/2014 1521  ? CALCIUM 8.0 (L) 08/09/2021 4008  ? PROT 7.7 08/05/2021 1140  ? ALBUMIN 4.1 08/05/2021 1140  ? AST 24 08/05/2021 1140  ? ALT 22 08/05/2021 1140  ? ALKPHOS 58 08/05/2021 1140  ? BILITOT 1.9 (H) 08/05/2021 1140  ? GFRNONAA >60 08/09/2021 0823  ? GFRNONAA 79  02/12/2014 1521  ? GFRAA >89 02/12/2014 1521  ? ?Lipase  ?   ?Component Value Date/Time  ? LIPASE 23 08/05/2021 1140  ? ? ? ? ? ?Studies/Results: ?CT ABDOMEN PELVIS W CONTRAST ? ?Result Date: 08/07/2021 ?CLINICAL DATA:  Perforated appendicitis follow-up. EXAM: CT ABDOMEN AND PELVIS WITH CONTRAST TECHNIQUE: Multidetector CT imaging of the abdomen and pelvis was performed using the standard protocol following bolus administration of intravenous contrast. RADIATION DOSE REDUCTION: This exam was performed according to the departmental dose-optimization program which includes automated exposure control, adjustment of the mA and/or kV according to patient size and/or use of iterative reconstruction technique. CONTRAST:  164m OMNIPAQUE IOHEXOL 300 MG/ML  SOLN COMPARISON:  CT abdomen pelvis dated Aug 05, 2021. FINDINGS: Lower chest: No acute abnormality. Hepatobiliary: No focal liver abnormality is seen. No gallstones, gallbladder wall thickening, or biliary dilatation. Pancreas: Unremarkable. No pancreatic ductal dilatation or surrounding inflammatory changes. Spleen: Normal in size without focal abnormality. Adrenals/Urinary Tract: Adrenal glands are unremarkable. Kidneys are normal, without renal calculi, focal lesion, or hydronephrosis. Bladder is unremarkable. Stomach/Bowel: Periappendiceal abscess has slightly increased in size since the prior study, currently measuring 5.3 x 3.9 x 6.9 cm, previously 4.9 x 3.6 x 6.9 cm (when remeasured). Three appendicoliths again noted. Worsened mild small  bowel dilatation without transition point. Oral contrast reaches the rectum. Unchanged small hiatal hernia. The stomach is otherwise within normal limits. Vascular/Lymphatic: Aortic atherosclerosis. No enlarged abdominal or pelvic lymph nodes. Reproductive: Status post hysterectomy. No adnexal masses. Other: Trace free fluid in the pelvis is slightly increased. No pneumoperitoneum. Musculoskeletal: No acute or significant osseous  findings. New subcutaneous foci of air in the left anterior abdominal wall, likely related to injections. IMPRESSION: 1. Slight interval increase in size of the periappendiceal abscess. 2. Worsened mild small bowel ileus, although oral contrast reaches the rectum. 3. Aortic Atherosclerosis (ICD10-I70.0). Electronically Signed   By: Titus Dubin M.D.   On: 08/07/2021 15:17  ? ?CT IMAGE GUIDED DRAINAGE BY PERCUTANEOUS CATHETER ? ?Result Date: 08/08/2021 ?INDICATION: 71 year old with perforated appendicitis with periappendiceal abscess. EXAM: CT-GUIDED DRAIN PLACEMENT IN PERIAPPENDICEAL ABSCESS MEDICATIONS: Moderate sedation ANESTHESIA/SEDATION: Moderate (conscious) sedation was employed during this procedure. A total of Versed 0.'5mg'$  and fentanyl 25 mcg was administered intravenously at the order of the provider performing the procedure. Total intra-service moderate sedation time: 35 minutes. Patient's level of consciousness and vital signs were monitored continuously by radiology nurse throughout the procedure under the supervision of the provider performing the procedure. COMPLICATIONS: None immediate. PROCEDURE: Informed written consent was obtained from the patient after a thorough discussion of the procedural risks, benefits and alternatives. All questions were addresseda timeout was performed prior to the initiation of the procedure. Patient was placed supine on the CT scanner. Patient was repositioned with the right side more elevated. Follow up CT images were obtained. Percutaneous window between the right colon and the right iliac vessels was identified. The right lower quadrant of the abdomen was prepped with chlorhexidine and sterile field was created. Maximal barrier sterile technique was utilized including caps, mask, sterile gowns, sterile gloves, sterile drape, hand hygiene and skin antiseptic. Skin was anesthetized with 1% lidocaine. A small incision was made. Using CT guidance, an 18 gauge trocar  needle was directed into the periappendiceal abscess between the right iliac vessels and the right colon. Brown foul-smelling fluid was aspirated from the needle and a superstiff Amplatz wire was advanced in the collection. Follow up CT images were obtained. The tract was dilated to accommodate a 10 [place] multipurpose drain. 12 mL of additional brown fluid was aspirated and follow up CT images were obtained. The drain was sutured to skin and attached to a suction bulb. Fluid sample was sent for culture. Bandage was placed. RADIATION DOSE REDUCTION: This exam was performed according to the departmental dose-optimization program which includes automated exposure control, adjustment of the mA and/or kV according to patient size and/or use of iterative reconstruction technique. FINDINGS: Complex periappendiceal abscess in the right lower quadrant of the abdomen. Appendicoliths are present. Partial decompression of the complex periappendiceal abscess following drain placement. 12 mL of foul-smelling brown fluid was removed from the drain. IMPRESSION: CT-guided placement of a drainage catheter into the periappendiceal abscess. Electronically Signed   By: Markus Daft M.D.   On: 08/08/2021 17:48   ? ?Anti-infectives: ?Anti-infectives (From admission, onward)  ? ? Start     Dose/Rate Route Frequency Ordered Stop  ? 08/06/21 0200  metroNIDAZOLE (FLAGYL) IVPB 500 mg       ?See Hyperspace for full Linked Orders Report.  ? 500 mg ?100 mL/hr over 60 Minutes Intravenous Every 12 hours 08/05/21 1506 08/13/21 0159  ? 08/05/21 2300  cefTRIAXone (ROCEPHIN) 2 g in sodium chloride 0.9 % 100 mL IVPB       ?  See Hyperspace for full Linked Orders Report.  ? 2 g ?200 mL/hr over 30 Minutes Intravenous Every 24 hours 08/05/21 1506 08/12/21 2259  ? 08/05/21 1300  ceFEPIme (MAXIPIME) 2 g in sodium chloride 0.9 % 100 mL IVPB       ?See Hyperspace for full Linked Orders Report.  ? 2 g ?200 mL/hr over 30 Minutes Intravenous  Once 08/05/21 1254  08/05/21 1338  ? 08/05/21 1300  metroNIDAZOLE (FLAGYL) IVPB 500 mg       ?See Hyperspace for full Linked Orders Report.  ? 500 mg ?100 mL/hr over 60 Minutes Intravenous  Once 08/05/21 1254 08/05/21 1448  ? ?  ? ? ? ?Ass

## 2021-08-10 LAB — CBC
HCT: 32.7 % — ABNORMAL LOW (ref 36.0–46.0)
Hemoglobin: 11.5 g/dL — ABNORMAL LOW (ref 12.0–15.0)
MCH: 32.6 pg (ref 26.0–34.0)
MCHC: 35.2 g/dL (ref 30.0–36.0)
MCV: 92.6 fL (ref 80.0–100.0)
Platelets: 326 10*3/uL (ref 150–400)
RBC: 3.53 MIL/uL — ABNORMAL LOW (ref 3.87–5.11)
RDW: 12.7 % (ref 11.5–15.5)
WBC: 7.7 10*3/uL (ref 4.0–10.5)
nRBC: 0 % (ref 0.0–0.2)

## 2021-08-10 LAB — BASIC METABOLIC PANEL
Anion gap: 8 (ref 5–15)
BUN: 6 mg/dL — ABNORMAL LOW (ref 8–23)
CO2: 21 mmol/L — ABNORMAL LOW (ref 22–32)
Calcium: 8.2 mg/dL — ABNORMAL LOW (ref 8.9–10.3)
Chloride: 108 mmol/L (ref 98–111)
Creatinine, Ser: 0.56 mg/dL (ref 0.44–1.00)
GFR, Estimated: >60 mL/min (ref >60)
Glucose, Bld: 101 mg/dL — ABNORMAL HIGH (ref 70–99)
Potassium: 3.6 mmol/L (ref 3.5–5.1)
Sodium: 137 mmol/L (ref 135–145)

## 2021-08-10 MED ORDER — APIXABAN 5 MG PO TABS
5.0000 mg | ORAL_TABLET | Freq: Two times a day (BID) | ORAL | Status: DC
  Administered 2021-08-10 – 2021-08-11 (×3): 5 mg via ORAL
  Filled 2021-08-10 (×3): qty 1

## 2021-08-10 NOTE — Progress Notes (Signed)
Chaplain engaged in an initial visit with Anita Donovan and her dear friend at her bedside.  Chaplain sat down with Anita Donovan and learned of the passing of her friend Anita Donovan.  They had been friends for over thirty years meeting through a business relationship.  She described Anita Donovan as being very loving and kind.  She noted that Anita Donovan would have been one of those people to show up to the hospital to see her and check on her.  Anita Donovan passed in hospice while Anita Donovan has been hospitalized. Anita Donovan talked about having a really close knit friend group that has known each other for years through various transitions.  Chaplain celebrated the blessings of having such a community and support system around her, while acknowledging the pain of losing a close friend.   ? ?Anita Donovan transitioning has also brought up Anita Donovan husband who passed last June.  She is coming up on the 1 year anniversary of his death.  Chaplain could assess that Anita Donovan has been experiencing a significant amount of grief.  Chaplain asked Anita Donovan about the ways she has been taking care of herself through such major and hard events and she voiced that she does attend a grief group.  Chaplain also brought up counseling and regular therapy as a resource.  She is connected to a grief counselor who happens to be her neighbor.  Chaplain trusted that if Anita Donovan needed extra guidance and support she could reach out to someone she knows directly.    ? ?Anita Donovan also was able to share about her faith and where she has been regarding her questions towards God.   ? ?Chaplain offered reflective listening, a compassionate presence, empathy, and prayer upon her consent.  ? ? ? 08/10/21 1400  ?Clinical Encounter Type  ?Visited With Patient and family together  ?Visit Type Initial;Spiritual support  ?Referral From Nurse  ?Consult/Referral To Chaplain  ?Spiritual Encounters  ?Spiritual Needs Prayer  ? ? ?

## 2021-08-10 NOTE — Progress Notes (Signed)
? ? ?Referring Physician(s): ?Toth,P ? ?Supervising Physician: Aletta Edouard ? ?Patient Status:  [facility] - In-pt ? ?Chief Complaint: ?Right lower abdominal pain/periappendiceal abscess ? ? ?Subjective: ?Patient doing fairly well today.  Does have some soreness in right lower quadrant ,more noticeable with movement.  Denies fever, chills, nausea, vomiting. ? ? ?Allergies: ?Patient has no known allergies. ? ?Medications: ?Prior to Admission medications   ?Medication Sig Start Date End Date Taking? Authorizing Provider  ?alendronate (FOSAMAX) 70 MG tablet Take 1 tablet by mouth every Monday. 04/20/16  Yes [provider]  ?apixaban (ELIQUIS) 5 MG TABS tablet TAKE 1 TABLET(5 MG) BY MOUTH TWICE DAILY ?Patient taking differently: Take 5 mg by mouth 2 (two) times daily. 04/12/21  Yes Deberah Pelton, NP  ?b complex vitamins capsule Take 1 capsule by mouth daily.   Yes [provider]  ?Calcium Carbonate-Vitamin D (CALTRATE 600+D PO) Take 1 capsule by mouth daily.   Yes [provider]  ?carvedilol (COREG) 6.25 MG tablet TAKE 1 TABLET(6.25 MG) BY MOUTH TWICE DAILY ?Patient taking differently: Take 6.25 mg by mouth 2 (two) times daily with a meal. TAKE 1 TABLET(6.25 MG) BY MOUTH TWICE DAILY 04/20/21  Yes Lelon Perla, MD  ?Fluticasone-Umeclidin-Vilant 100-62.5-25 MCG/INH AEPB Inhale 1 puff into the lungs daily. 11/22/20  Yes [provider]  ?levothyroxine (SYNTHROID) 50 MCG tablet Take 50 mcg by mouth daily before breakfast.   Yes [provider]  ?Milk Thistle 250 MG CAPS Take 250 mg by mouth 2 (two) times daily.   Yes [provider]  ?Multiple Vitamins-Minerals (CENTRUM SILVER ADULT 50+ PO) Take 1 tablet by mouth daily.   Yes [provider]  ?nitroGLYCERIN (NITROSTAT) 0.4 MG SL tablet Place 1 tablet (0.4 mg total) under the tongue every 5 (five) minutes as needed for chest pain. 04/05/20 08/05/21 Yes Kroeger, Lorelee Cover., PA-C  ?Omega-3 Fatty Acids (FISH OIL)  1200 MG CAPS Take 1 capsule by mouth daily.   Yes [provider]  ?Coenzyme Q10 (COQ10 PO) Take 10 mLs by mouth daily. ?Patient not taking: Reported on 08/05/2021    [provider]  ?metoprolol succinate (TOPROL-XL) 25 MG 24 hr tablet Take 25 mg by mouth daily as needed (rapid heart rate). ?Patient not taking: Reported on 08/05/2021    [provider]  ? ? ? ?Vital Signs: ?BP 117/60 (BP Location: Left Arm)   Pulse 82   Temp 98.2 ?F (36.8 ?C) (Oral)   Resp 18   Ht '5\' 7"'$  (1.702 m)   Wt 116 lb (52.6 kg)   SpO2 99%   BMI 18.17 kg/m?  ? ?Physical Exam awake, alert.  Right lower quadrant drain intact, insertion site okay, mildly tender to palpation, output yesterday 30 cc, today 10 cc turbid red fluid; drain irrigated without difficulty. ? ?Imaging: ?CT ABDOMEN PELVIS W CONTRAST ? ?Result Date: 08/07/2021 ?CLINICAL DATA:  Perforated appendicitis follow-up. EXAM: CT ABDOMEN AND PELVIS WITH CONTRAST TECHNIQUE: Multidetector CT imaging of the abdomen and pelvis was performed using the standard protocol following bolus administration of intravenous contrast. RADIATION DOSE REDUCTION: This exam was performed according to the departmental dose-optimization program which includes automated exposure control, adjustment of the mA and/or kV according to patient size and/or use of iterative reconstruction technique. CONTRAST:  136m OMNIPAQUE IOHEXOL 300 MG/ML  SOLN COMPARISON:  CT abdomen pelvis dated Aug 05, 2021. FINDINGS: Lower chest: No acute abnormality. Hepatobiliary: No focal liver abnormality is seen. No gallstones, gallbladder wall thickening, or biliary dilatation.  Pancreas: Unremarkable. No pancreatic ductal dilatation or surrounding inflammatory changes. Spleen: Normal in size without focal abnormality. Adrenals/Urinary Tract: Adrenal glands are unremarkable. Kidneys are normal, without renal calculi, focal lesion, or hydronephrosis. Bladder is unremarkable. Stomach/Bowel: Periappendiceal  abscess has slightly increased in size since the prior study, currently measuring 5.3 x 3.9 x 6.9 cm, previously 4.9 x 3.6 x 6.9 cm (when remeasured). Three appendicoliths again noted. Worsened mild small bowel dilatation without transition point. Oral contrast reaches the rectum. Unchanged small hiatal hernia. The stomach is otherwise within normal limits. Vascular/Lymphatic: Aortic atherosclerosis. No enlarged abdominal or pelvic lymph nodes. Reproductive: Status post hysterectomy. No adnexal masses. Other: Trace free fluid in the pelvis is slightly increased. No pneumoperitoneum. Musculoskeletal: No acute or significant osseous findings. New subcutaneous foci of air in the left anterior abdominal wall, likely related to injections. IMPRESSION: 1. Slight interval increase in size of the periappendiceal abscess. 2. Worsened mild small bowel ileus, although oral contrast reaches the rectum. 3. Aortic Atherosclerosis (ICD10-I70.0). Electronically Signed   By: Titus Dubin M.D.   On: 08/07/2021 15:17  ? ?CT IMAGE GUIDED DRAINAGE BY PERCUTANEOUS CATHETER ? ?Result Date: 08/08/2021 ?INDICATION: 71 year old with perforated appendicitis with periappendiceal abscess. EXAM: CT-GUIDED DRAIN PLACEMENT IN PERIAPPENDICEAL ABSCESS MEDICATIONS: Moderate sedation ANESTHESIA/SEDATION: Moderate (conscious) sedation was employed during this procedure. A total of Versed 0.'5mg'$  and fentanyl 25 mcg was administered intravenously at the order of the provider performing the procedure. Total intra-service moderate sedation time: 35 minutes. Patient's level of consciousness and vital signs were monitored continuously by radiology nurse throughout the procedure under the supervision of the provider performing the procedure. COMPLICATIONS: None immediate. PROCEDURE: Informed written consent was obtained from the patient after a thorough discussion of the procedural risks, benefits and alternatives. All questions were addresseda timeout was  performed prior to the initiation of the procedure. Patient was placed supine on the CT scanner. Patient was repositioned with the right side more elevated. Follow up CT images were obtained. Percutaneous window between the right colon and the right iliac vessels was identified. The right lower quadrant of the abdomen was prepped with chlorhexidine and sterile field was created. Maximal barrier sterile technique was utilized including caps, mask, sterile gowns, sterile gloves, sterile drape, hand hygiene and skin antiseptic. Skin was anesthetized with 1% lidocaine. A small incision was made. Using CT guidance, an 18 gauge trocar needle was directed into the periappendiceal abscess between the right iliac vessels and the right colon. Brown foul-smelling fluid was aspirated from the needle and a superstiff Amplatz wire was advanced in the collection. Follow up CT images were obtained. The tract was dilated to accommodate a 10 [place] multipurpose drain. 12 mL of additional brown fluid was aspirated and follow up CT images were obtained. The drain was sutured to skin and attached to a suction bulb. Fluid sample was sent for culture. Bandage was placed. RADIATION DOSE REDUCTION: This exam was performed according to the departmental dose-optimization program which includes automated exposure control, adjustment of the mA and/or kV according to patient size and/or use of iterative reconstruction technique. FINDINGS: Complex periappendiceal abscess in the right lower quadrant of the abdomen. Appendicoliths are present. Partial decompression of the complex periappendiceal abscess following drain placement. 12 mL of foul-smelling brown fluid was removed from the drain. IMPRESSION: CT-guided placement of a drainage catheter into the periappendiceal abscess. Electronically Signed   By: Markus Daft M.D.   On: 08/08/2021 17:48   ? ?Labs: ? ?CBC: ?Recent Labs  ?  08/06/21 ?8485 08/07/21 ?0740 08/08/21 ?9276 08/10/21 ?0810  ?WBC  11.3* 9.1 9.2 7.7  ?HGB 12.1 12.3 12.2 11.5*  ?HCT 36.5 34.9* 37.0 32.7*  ?PLT 207 235 250 326  ? ? ?COAGS: ?Recent Labs  ?  08/07/21 ?0740  ?INR 1.0  ? ? ?BMP: ?Recent Labs  ?  08/07/21 ?0740 08/08/21 ?0328 08/09/21

## 2021-08-10 NOTE — Progress Notes (Signed)
[facility] ?Progress Note ? ?   ?Subjective: ?CC:  ?Overall feels better.  Tolerating p.o., had 2 Ensure yesterday, did not eat much else because she does not like hospital food.  Having flatus.  Having small loose stools.  Denies nausea vomiting. ? ?Objective: ?Vital signs in last 24 hours: ?Temp:  [97.7 ?F (36.5 ?C)-99.2 ?F (37.3 ?C)] 98.2 ?F (36.8 ?C) (05/11 6222) ?Pulse Rate:  [82-100] 82 (05/11 0921) ?Resp:  [14-19] 18 (05/11 0921) ?BP: (87-117)/(53-72) 117/60 (05/11 9798) ?SpO2:  [97 %-100 %] 99 % (05/11 0921) ?Last BM Date : 08/09/21 ? ?Intake/Output from previous day: ?05/10 0701 - 05/11 0700 ?In: 2189.1 [P.O.:180; I.V.:1604.1; IV Piggyback:400] ?Out: 30 [Drains:30] ?Intake/Output this shift: ?Total I/O ?In: 330 [P.O.:330] ?Out: 10 [Drains:10] ? ?PE: ?Gen:  Alert, NAD, pleasant ?Card:  Regular rate and rhythm, pedal pulses 2+ BL ?Pulm:  Normal effort ORA ?Abd: Soft, mild distention improved compared to previous exam, RLQ JP with purulent tan fluid. Appropriately tender. No peritonitis  ?Skin: warm and dry, no rashes  ?Psych: A&Ox3  ? ?Lab Results:  ?Recent Labs  ?  08/08/21 ?0328 08/10/21 ?0810  ?WBC 9.2 7.7  ?HGB 12.2 11.5*  ?HCT 37.0 32.7*  ?PLT 250 326  ? ?BMET ?Recent Labs  ?  08/09/21 ?0823 08/10/21 ?0810  ?NA 134* 137  ?K 3.4* 3.6  ?CL 110 108  ?CO2 14* 21*  ?GLUCOSE 99 101*  ?BUN 11 6*  ?CREATININE 0.50 0.56  ?CALCIUM 8.0* 8.2*  ? ?PT/INR ?No results for input(s): LABPROT, INR in the last 72 hours. ? ?CMP  ?   ?Component Value Date/Time  ? NA 137 08/10/2021 0810  ? NA 135 12/12/2020 1130  ? K 3.6 08/10/2021 0810  ? CL 108 08/10/2021 0810  ? CO2 21 (L) 08/10/2021 0810  ? GLUCOSE 101 (H) 08/10/2021 0810  ? BUN 6 (L) 08/10/2021 0810  ? BUN 17 12/12/2020 1130  ? CREATININE 0.56 08/10/2021 0810  ? CREATININE 0.79 02/12/2014 1521  ? CALCIUM 8.2 (L) 08/10/2021 0810  ? PROT 7.7 08/05/2021 1140  ? ALBUMIN 4.1 08/05/2021 1140  ? AST 24 08/05/2021 1140  ? ALT 22 08/05/2021 1140  ? ALKPHOS 58  08/05/2021 1140  ? BILITOT 1.9 (H) 08/05/2021 1140  ? GFRNONAA >60 08/10/2021 0810  ? GFRNONAA 79 02/12/2014 1521  ? GFRAA >89 02/12/2014 1521  ? ?Lipase  ?   ?Component Value Date/Time  ? LIPASE 23 08/05/2021 1140  ? ? ? ? ? ?Studies/Results: ?CT IMAGE GUIDED DRAINAGE BY PERCUTANEOUS CATHETER ? ?Result Date: 08/08/2021 ?INDICATION: 71 year old with perforated appendicitis with periappendiceal abscess. EXAM: CT-GUIDED DRAIN PLACEMENT IN PERIAPPENDICEAL ABSCESS MEDICATIONS: Moderate sedation ANESTHESIA/SEDATION: Moderate (conscious) sedation was employed during this procedure. A total of Versed 0.'5mg'$  and fentanyl 25 mcg was administered intravenously at the order of the provider performing the procedure. Total intra-service moderate sedation time: 35 minutes. Patient's level of consciousness and vital signs were monitored continuously by radiology nurse throughout the procedure under the supervision of the provider performing the procedure. COMPLICATIONS: None immediate. PROCEDURE: Informed written consent was obtained from the patient after a thorough discussion of the procedural risks, benefits and alternatives. All questions were addresseda timeout was performed prior to the initiation of the procedure. Patient was placed supine on the CT scanner. Patient was repositioned with the right side more elevated. Follow up CT images were obtained. Percutaneous window between the right colon and the right iliac vessels was identified. The right lower quadrant of the abdomen was  prepped with chlorhexidine and sterile field was created. Maximal barrier sterile technique was utilized including caps, mask, sterile gowns, sterile gloves, sterile drape, hand hygiene and skin antiseptic. Skin was anesthetized with 1% lidocaine. A small incision was made. Using CT guidance, an 18 gauge trocar needle was directed into the periappendiceal abscess between the right iliac vessels and the right colon. Brown foul-smelling fluid was  aspirated from the needle and a superstiff Amplatz wire was advanced in the collection. Follow up CT images were obtained. The tract was dilated to accommodate a 10 [place] multipurpose drain. 12 mL of additional brown fluid was aspirated and follow up CT images were obtained. The drain was sutured to skin and attached to a suction bulb. Fluid sample was sent for culture. Bandage was placed. RADIATION DOSE REDUCTION: This exam was performed according to the departmental dose-optimization program which includes automated exposure control, adjustment of the mA and/or kV according to patient size and/or use of iterative reconstruction technique. FINDINGS: Complex periappendiceal abscess in the right lower quadrant of the abdomen. Appendicoliths are present. Partial decompression of the complex periappendiceal abscess following drain placement. 12 mL of foul-smelling brown fluid was removed from the drain. IMPRESSION: CT-guided placement of a drainage catheter into the periappendiceal abscess. Electronically Signed   By: Markus Daft M.D.   On: 08/08/2021 17:48   ? ?Anti-infectives: ?Anti-infectives (From admission, onward)  ? ? Start     Dose/Rate Route Frequency Ordered Stop  ? 08/06/21 0200  metroNIDAZOLE (FLAGYL) IVPB 500 mg       ?See Hyperspace for full Linked Orders Report.  ? 500 mg ?100 mL/hr over 60 Minutes Intravenous Every 12 hours 08/05/21 1506 08/13/21 0159  ? 08/05/21 2300  cefTRIAXone (ROCEPHIN) 2 g in sodium chloride 0.9 % 100 mL IVPB       ?See Hyperspace for full Linked Orders Report.  ? 2 g ?200 mL/hr over 30 Minutes Intravenous Every 24 hours 08/05/21 1506 08/12/21 2259  ? 08/05/21 1300  ceFEPIme (MAXIPIME) 2 g in sodium chloride 0.9 % 100 mL IVPB       ?See Hyperspace for full Linked Orders Report.  ? 2 g ?200 mL/hr over 30 Minutes Intravenous  Once 08/05/21 1254 08/05/21 1338  ? 08/05/21 1300  metroNIDAZOLE (FLAGYL) IVPB 500 mg       ?See Hyperspace for full Linked Orders Report.  ? 500 mg ?100 mL/hr  over 60 Minutes Intravenous  Once 08/05/21 1254 08/05/21 1448  ? ?  ? ? ? ?Assessment/Plan ? HD#5 - acute appendicitis with perforation, appendicoliths on anticoagulation ?            - hold Eliquis - last dose 5/6 AM; will discuss resumption of Eliquis today with attending MD ?            - IV Rocephin, Flagyl ?            - Repeat CT scan 5/8 w/ interval increase in periappendiceal abscess, s/p IR drain 5/9; GS w/ GPC in pairs. Follow Cx ? - pain due to drain, clinically improving with bowel function ? ?FEN: Soft diet, Ensure ?ID: Rocephin/Flagyl; hopefully cultures will speciate in the next 24 hours to help direct antibiotics at discharge ?VTE: SCD's, lovenox ?Foley: none ?Dispo: med-surg, saline lock IV, encourage mobilization, follow cultures, possible discharge home tomorrow on p.o. antibiotics ? ? LOS: 5 days  ? ?I reviewed nursing notes, last 24 h vitals and pain scores, last 48 h intake and output, last 24  h labs and trends, and last 24 h imaging results. ? ? ?Obie Dredge, PA-C ?[facility] ?Please see Amion for pager number during day hours 7:00am-4:30pm ? ? ? ? ? ? ?

## 2021-08-10 NOTE — Plan of Care (Signed)
?  Problem: Activity: ?Goal: Risk for activity intolerance will decrease ?Outcome: Adequate for Discharge ?  ?Problem: Nutrition: ?Goal: Adequate nutrition will be maintained ?Outcome: Progressing ?  ?Problem: Safety: ?Goal: Ability to remain free from injury will improve ?Outcome: Adequate for Discharge ?  ?

## 2021-08-11 LAB — AEROBIC/ANAEROBIC CULTURE W GRAM STAIN (SURGICAL/DEEP WOUND)

## 2021-08-11 MED ORDER — METRONIDAZOLE 500 MG PO TABS: 500.0000 mg | ORAL_TABLET | Freq: Two times a day (BID) | ORAL | 0 refills | Status: DC

## 2021-08-11 MED ORDER — SULFAMETHOXAZOLE-TRIMETHOPRIM 800-160 MG PO TABS: 1.0000 | ORAL_TABLET | Freq: Two times a day (BID) | ORAL | 1 refills | Status: DC

## 2021-08-11 NOTE — Plan of Care (Signed)
Discharge instructions given to the patient and family including medications, follow up and drain care (flushing and emptying).  ?

## 2021-08-11 NOTE — Discharge Instructions (Signed)
DRAIN CARE --  ?once daily flushing of drain with 5 cc sterile saline ?output recording of the amount coming from drain  ?gauze dressing changes every 2 to 3 days   ?Patient will be scheduled for follow-up in IR clinic in 10 to 14 days.   ?Please call [phone] with any drain related questions. ? ? ?_________________________________________________________________________________ ? ?

## 2021-08-11 NOTE — Progress Notes (Signed)
Doing well.  ?Pain around drain controlled w/ tylenol ?Tolerating PO.  ?Having BMs. ?Plan discharge home today after drain teaching with a friend/family member. Will need PO abx for at least 4 weeks per Dr. Marlou Starks. ? ?Discharge summary to follow  ? ?Obie Dredge, PA-C ?[facility] ?Please see Amion for pager number during day hours 7:00am-4:30pm ? ? ? ? ? ?

## 2021-08-11 NOTE — Plan of Care (Signed)
?  Problem: Activity: ?Goal: Risk for activity intolerance will decrease ?Outcome: Progressing ?  ?Problem: Clinical Measurements: ?Goal: Ability to maintain clinical measurements within normal limits will improve ?Outcome: Progressing ?  ?Problem: Safety: ?Goal: Ability to remain free from injury will improve ?Outcome: Progressing ?  ?

## 2021-08-11 NOTE — Care Management Important Message (Signed)
Important Message ? ?Patient Details IM Letter mailed to Patient. ?Name: Anita Donovan ?MRN: [id number] ?Date of Birth: 06-08-1950 ? ? ?Medicare Important Message Given:  Yes - Important Message mailed due to current National Emergency ? ? ? ? ?Kerin Salen ?08/11/2021, 3:07 PM ?

## 2021-08-14 NOTE — Discharge Summary (Signed)
[facility] ?Discharge Summary  ? ?Patient ID: ?Anita Donovan ?MRN: [id number] ?DOB/AGE: [birth year] 71 y.o. ? ?Admit date: 08/05/2021 ?Discharge date: 08/11/2021 ? ?Admitting Diagnosis: ?Appendicitis with perforation ? ?Discharge Diagnosis ?Patient Active Problem List  ? Diagnosis Date Noted  ? Perforated appendicitis 08/05/2021  ? Menopausal symptom 01/19/2021  ? Polyp of colon 01/19/2021  ? Persistent atrial fibrillation ([place]) 12/20/2020  ? History of MI (myocardial infarction) 08/11/2020  ? Hypothyroidism 08/11/2020  ? NSTEMI (non-ST elevated myocardial infarction) ([place]) 04/05/2020  ? Underweight 09/18/2018  ? Osteoporosis 05/18/2015  ? Bruit 05/21/2014  ? Palpitations 02/12/2014  ? SVT (supraventricular tachycardia) ([place]) 02/12/2014  ? History of tobacco use 02/12/2014  ? ? ?Consultants ?Interventional radiology  ? ?Imaging: ?CT ABDOMEN PELVIS 08/05/21  ?Stomach/Bowel: Perforated appendicitis is seen, with rim enhancing ?fluid and gas collection in the periappendiceal region which ?measures 3.9 x 3.4 cm. This is consistent with periappendiceal ?abscess. Mild dilatation of small bowel loops is seen without ?transition point, consistent with ileus. ? ? ?CT ABDOMEN PELVIS 08/07/21  ?Stomach/Bowel: Periappendiceal abscess has slightly increased in ?size since the prior study, currently measuring 5.3 x 3.9 x 6.9 cm, ?previously 4.9 x 3.6 x 6.9 cm (when remeasured). Three ?appendicoliths again noted. Worsened mild small bowel dilatation ?without transition point. Oral contrast reaches the rectum. ?Unchanged small hiatal hernia. The stomach is otherwise within ?normal limits. ? ?Procedures ?08/08/21 CT guided drainage by percutaneous catheter  ? ?HPI:  ?Anita Donovan is an 71 y.o. female who is here for abdominal pain that started Wednesday.  It is gotten worse since then.  Last night she began to have significant diarrhea as well.  Patient denies any nausea vomiting or fevers.  Patient is on Eliquis due to  A-fib. ? ?Hospital Course:  ?Workup revealed perforated appendicitis with appendicoliths - see above CT from 5/6. She was admitted and started on IV abx. Her Eliquis was held. On 5/8 CT was repeated which showed progression of IAA and IR was consulted for drainage. IR drain was placed. The patient clinically improved and her diet was gradually advanced. On 08/11/21 patients was pain was improving,  was voiding well, tolerating diet, ambulating well, vital signs stable, and felt stable for discharge home with the IR drain in place  Patient will follow up in our office in 2 weeks and knows to call with questions or concerns.  ? ?Physical Exam: ?General:  Alert, NAD, pleasant, comfortable ?Abd:  Soft, ND, mild tenderness around drain, drainage is purulent ? ?Allergies as of 08/11/2021   ?No Known Allergies ?  ? ?  ?Medication List  ?  ? ?TAKE these medications   ? ?alendronate 70 MG tablet ?Commonly known as: FOSAMAX ?Take 1 tablet by mouth every Monday. ?  ?b complex vitamins capsule ?Take 1 capsule by mouth daily. ?  ?CALTRATE 600+D PO ?Take 1 capsule by mouth daily. ?  ?carvedilol 6.25 MG tablet ?Commonly known as: COREG ?TAKE 1 TABLET(6.25 MG) BY MOUTH TWICE DAILY ?What changed: See the new instructions. ?  ?CENTRUM SILVER ADULT 50+ PO ?Take 1 tablet by mouth daily. ?  ?COQ10 PO ?Take 10 mLs by mouth daily. ?  ?Eliquis 5 MG Tabs tablet ?Generic drug: apixaban ?TAKE 1 TABLET(5 MG) BY MOUTH TWICE DAILY ?What changed: See the new instructions. ?  ?Fish Oil 1200 MG Caps ?Take 1 capsule by mouth daily. ?  ?Fluticasone-Umeclidin-Vilant 100-62.5-25 MCG/INH Aepb ?Inhale 1 puff into the lungs daily. ?  ?levothyroxine 50 MCG tablet ?Commonly known  as: SYNTHROID ?Take 50 mcg by mouth daily before breakfast. ?  ?metoprolol succinate 25 MG 24 hr tablet ?Commonly known as: TOPROL-XL ?Take 25 mg by mouth daily as needed (rapid heart rate). ?  ?metroNIDAZOLE 500 MG tablet ?Commonly known as: Flagyl ?Take 1 tablet (500 mg total) by  mouth 2 (two) times daily for 21 days. DO NOT CONSUME ALCOHOL WHILE TAKING THIS MEDICATION. ?  ?Milk Thistle 250 MG Caps ?Take 250 mg by mouth 2 (two) times daily. ?  ?nitroGLYCERIN 0.4 MG SL tablet ?Commonly known as: Nitrostat ?Place 1 tablet (0.4 mg total) under the tongue every 5 (five) minutes as needed for chest pain. ?  ?sulfamethoxazole-trimethoprim 800-160 MG tablet ?Commonly known as: BACTRIM DS ?Take 1 tablet by mouth 2 (two) times daily for 21 days. ?  ? ?  ? ? ? ? Follow-up Information   ? ? Jovita Kussmaul, MD Follow up.   ?Specialty: General Surgery ?Why: Our office is scheduling you for follow up to discuss appendectomy. please call to confirm appointment date/time. ?Contact information: ?[address] ?[address] ?[place] [address] ?[phone] ? ? ?  ?  ? ?  ?  ? ?  ? ? ?Signed: ?Obie Dredge, PA-C ?[facility] ?08/14/2021, 1:33 PM ? ?

## 2021-08-16 ENCOUNTER — Other Ambulatory Visit (HOSPITAL_COMMUNITY): Payer: Self-pay

## 2021-08-16 MED ORDER — NORMAL SALINE FLUSH 0.9 % IV SOLN
INTRAVENOUS | 3 refills | Status: DC
  Filled 2021-08-16: qty 300, 30d supply, fill #0

## 2021-08-17 ENCOUNTER — Other Ambulatory Visit (HOSPITAL_COMMUNITY): Payer: Self-pay

## 2021-08-31 ENCOUNTER — Ambulatory Visit: Payer: Self-pay | Admitting: General Surgery

## 2021-09-01 ENCOUNTER — Telehealth: Payer: Self-pay

## 2021-09-01 NOTE — Telephone Encounter (Signed)
    Name: Anita Donovan  DOB: 1951/02/18  MRN: [id number]  Primary Cardiologist: Kirk Ruths, MD   Preoperative team, please contact this patient and set up a phone call appointment for further preoperative risk assessment. Please obtain consent and complete medication review. Thank you for your help.  I confirm that guidance regarding antiplatelet and oral anticoagulation therapy has been completed and, if necessary, noted below.    Mable Fill, Marissa Nestle, NP 09/01/2021, 1:02 PM [place] [address] [place], [place] [address]

## 2021-09-01 NOTE — Telephone Encounter (Signed)
Appointment scheduled; consent given, med list reviewed.

## 2021-09-01 NOTE — Telephone Encounter (Signed)
   Pre-operative Risk Assessment    Patient Name: Anita Donovan  DOB: 04/11/1950 MRN: [id number]      Request for Surgical Clearance    Procedure:   Appendectomy Surgery  Date of Surgery:  Clearance TBD                                 Surgeon:  Jovita Kussmaul, MD Surgeon's Group or Practice Name:  [facility]  Phone number:  [phone] Fax number:  [phone] Carlene Coria, CMA   Type of Clearance Requested:   - Medical    Type of Anesthesia:  General    Additional requests/questions:  Please advise surgeon/provider what medications should be held.  Signed, Elsie Lincoln Jamekia Gannett   09/01/2021, 12:47 PM

## 2021-09-01 NOTE — Telephone Encounter (Signed)
  Patient Consent for Virtual Visit         Anita Donovan has provided verbal consent on 09/01/2021 for a virtual visit (video or telephone).   CONSENT FOR VIRTUAL VISIT FOR:  Anita Donovan  By participating in this virtual visit I agree to the following:  I hereby voluntarily request, consent and authorize [facility] and its employed or contracted physicians, physician assistants, nurse practitioners or other licensed health care professionals (the Practitioner), to provide me with telemedicine health care services (the "Services") as deemed necessary by the treating Practitioner. I acknowledge and consent to receive the Services by the Practitioner via telemedicine. I understand that the telemedicine visit will involve communicating with the Practitioner through live audiovisual communication technology and the disclosure of certain medical information by electronic transmission. I acknowledge that I have been given the opportunity to request an in-person assessment or other available alternative prior to the telemedicine visit and am voluntarily participating in the telemedicine visit.  I understand that I have the right to withhold or withdraw my consent to the use of telemedicine in the course of my care at any time, without affecting my right to future care or treatment, and that the Practitioner or I may terminate the telemedicine visit at any time. I understand that I have the right to inspect all information obtained and/or recorded in the course of the telemedicine visit and may receive copies of available information for a reasonable fee.  I understand that some of the potential risks of receiving the Services via telemedicine include:  Delay or interruption in medical evaluation due to technological equipment failure or disruption; Information transmitted may not be sufficient (e.g. poor resolution of images) to allow for appropriate medical decision making by the Practitioner;  and/or  In rare instances, security protocols could fail, causing a breach of personal health information.  Furthermore, I acknowledge that it is my responsibility to provide information about my medical history, conditions and care that is complete and accurate to the best of my ability. I acknowledge that Practitioner's advice, recommendations, and/or decision may be based on factors not within their control, such as incomplete or inaccurate data provided by me or distortions of diagnostic images or specimens that may result from electronic transmissions. I understand that the practice of medicine is not an exact science and that Practitioner makes no warranties or guarantees regarding treatment outcomes. I acknowledge that a copy of this consent can be made available to me via my patient portal (Portal), or I can request a printed copy by calling the office of [facility].    I understand that my insurance will be billed for this visit.   I have read or had this consent read to me. I understand the contents of this consent, which adequately explains the benefits and risks of the Services being provided via telemedicine.  I have been provided ample opportunity to ask questions regarding this consent and the Services and have had my questions answered to my satisfaction. I give my informed consent for the services to be provided through the use of telemedicine in my medical care

## 2021-09-11 ENCOUNTER — Encounter: Payer: Self-pay | Admitting: Nurse Practitioner

## 2021-09-11 ENCOUNTER — Ambulatory Visit (INDEPENDENT_AMBULATORY_CARE_PROVIDER_SITE_OTHER): Payer: Medicare HMO | Admitting: Nurse Practitioner

## 2021-09-11 DIAGNOSIS — Z01810 Encounter for preprocedural cardiovascular examination: Secondary | ICD-10-CM

## 2021-09-11 NOTE — Telephone Encounter (Signed)
Patient is on Eliquis for atrial fibrillation. There is not a specific request to hold Eliquis, however clearance states advise of medications to be held. Routing to [employer] D for advisement.

## 2021-09-11 NOTE — Telephone Encounter (Addendum)
Patient with diagnosis of afib on Eliquis for anticoagulation.    Procedure: Appendectomy Surgery Date of procedure: TBD   CHA2DS2-VASc Score = 3  This indicates a 3.2% annual risk of stroke. The patient's score is based upon: CHF History: 0 HTN History: 0 Diabetes History: 0 Stroke History: 0 Vascular Disease History: 1 Age Score: 1 Gender Score: 1  CrCl 75 mL/min Platelet count 326K  Okay to hold Eliquis for 2-3 days prior to procedure. Virtual appointment scheduled for 09/11/21 with Christen Bame for CV risk assessment, follow-up her recommendations.

## 2021-09-11 NOTE — Progress Notes (Signed)
Virtual Visit via Telephone Note   Because of Anita Donovan's co-morbid illnesses, she is at least at moderate risk for complications without adequate follow up.  This format is felt to be most appropriate for this patient at this time.  The patient did not have access to video technology/had technical difficulties with video requiring transitioning to audio format only (telephone).  All issues noted in this document were discussed and addressed.  No physical exam could be performed with this format.  Please refer to the patient's chart for her consent to telehealth for [facility].  Evaluation Performed:  Preoperative cardiovascular risk assessment _____________   Date:  09/11/2021   Patient ID:  Anita Donovan, DOB Feb 04, 1951, MRN [id number] Patient Location:  Home Provider location:   Office  Primary Care Provider:  Bernerd Limbo, MD Primary Cardiologist:  Kirk Ruths, MD  Chief Complaint / Patient Profile   71 y.o. 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  Past Medical History    Past Medical History:  Diagnosis Date   Allergy    Arthritis    knee right   Multiple thyroid nodules    Osteoporosis    Persistent atrial fibrillation ([place]) 12/20/2020   SVT (supraventricular tachycardia) (H[facility])    Past Surgical History:  Procedure Laterality Date   CARDIOVERSION N/A 12/20/2020   Procedure: CARDIOVERSION;  Surgeon: Skeet Latch, MD;  Location: [facility];  Service: Cardiovascular;  Laterality: N/A;   COLONOSCOPY     LEFT HEART CATH AND CORONARY ANGIOGRAPHY N/A 04/05/2020   Procedure: LEFT HEART CATH AND CORONARY ANGIOGRAPHY;  Surgeon: Belva Crome, MD;  Location: [facility];  Service: Cardiovascular;  Laterality: N/A;    POLYPECTOMY     TONSILLECTOMY      Allergies  No Known Allergies  History of Present Illness    Anita Donovan is a 71 y.o. female who presents via audio/video conferencing for a telehealth visit today.  Pt was last seen in cardiology clinic on 05/02/2021 by Dr. Stanford Breed.  At that time Anita Donovan was doing well.  The patient is now pending procedure as outlined above. Since her last visit, she denies chest pain, dyspnea, presyncope, syncope, edema, orthopnea, PND, or other cardiac concerns. No bleeding concerns on Eliquis.    Home Medications    Prior to Admission medications   Medication Sig Start Date End Date Taking? Authorizing Provider  alendronate (FOSAMAX) 70 MG tablet Take 1 tablet by mouth every Monday. 04/20/16   [provider]  apixaban (ELIQUIS) 5 MG TABS tablet TAKE 1 TABLET(5 MG) BY MOUTH TWICE DAILY 04/12/21   Deberah Pelton, NP  b complex vitamins capsule Take 1 capsule by mouth daily. Patient not taking: Reported on 09/01/2021    [provider]  Calcium Carbonate-Vitamin D (CALTRATE 600+D PO) Take 1 capsule by mouth daily. Patient not taking: Reported on 09/01/2021    [provider]  carvedilol (COREG) 6.25 MG tablet TAKE 1 TABLET(6.25 MG) BY MOUTH TWICE DAILY 04/20/21   Lelon Perla, MD  ciprofloxacin (CIPRO) 250 MG tablet Take 250 mg by mouth 2 (two) times daily. 08/31/21   [provider]  Coenzyme Q10 (COQ10 PO) Take 10 mLs by mouth daily. Patient not taking: Reported on 08/05/2021    [provider]  Fluticasone-Umeclidin-Vilant 100-62.5-25 MCG/INH AEPB Inhale 1 puff into the lungs daily. 11/22/20   [provider]  levothyroxine (SYNTHROID) 50 MCG tablet Take 50 mcg by mouth daily before breakfast.    [provider]  metoprolol succinate (TOPROL-XL) 25 MG 24 hr tablet Take 25 mg by mouth daily as needed (rapid heart rate).    [provider]  Milk Thistle 250 MG CAPS Take 250 mg by mouth 2  (two) times daily. Patient not taking: Reported on 09/01/2021    [provider]  Multiple Vitamins-Minerals (CENTRUM SILVER ADULT 50+ PO) Take 1 tablet by mouth daily. Patient not taking: Reported on 09/01/2021    [provider]  nitroGLYCERIN (NITROSTAT) 0.4 MG SL tablet Place 1 tablet (0.4 mg total) under the tongue every 5 (five) minutes as needed for chest pain. 04/05/20 09/01/21  Kroeger, Lorelee Cover., PA-C  Omega-3 Fatty Acids (FISH OIL) 1200 MG CAPS Take 1 capsule by mouth daily. Patient not taking: Reported on 09/01/2021    [provider]  Sodium Chloride Flush (NORMAL SALINE FLUSH) 0.9 % SOLN Use 60m to flush abdominal drain once daily. **single use syringes** Patient not taking: Reported on 09/01/2021 08/10/21   Allred, DShirlyn Goltz PA-C    Physical Exam    Vital Signs:  Anita PAVONEdoes not have vital signs available for review today.  Given telephonic nature of communication, physical exam is limited. AAOx3. NAD. Normal affect.  Speech and respirations are unlabored.  Accessory Clinical Findings    None  Assessment & Plan    1.  Preoperative Cardiovascular Risk Assessment: She is doing well from a cardiac perspective and may proceed to surgery without further testing.  According to the Revised Cardiac Risk Index (RCRI), her Perioperative Risk of Major Cardiac Event is (%): 0.4. Her Functional Capacity in METs is: 7.59 according to the [facility] Activity Status Index (DASI).  Guidelines for holding Eliquis are as follows;  CHA2DS2-VASc Score = 3  This indicates a 3.2% annual risk of stroke. The patient's score is based upon: CHF History: 0 HTN History: 0 Diabetes History: 0 Stroke History: 0 Vascular Disease History: 1 Age Score: 1 Gender Score: 1   CrCl 75 mL/min Platelet count 326K   Okay to hold Eliquis for 2-3 days prior to procedure. Virtual appointment scheduled for 09/11/21 with MChristen Bamefor CV risk assessment, follow-up her  recommendations.    A copy of this note will be routed to requesting surgeon.  Time:   Today, I have spent 10 minutes with the patient with telehealth technology discussing medical history, symptoms, and management plan.     MEmmaline Life NP-C    09/11/2021, 9:13 AM [id number] Office [phone] [phone]

## 2021-09-18 ENCOUNTER — Other Ambulatory Visit: Payer: Self-pay | Admitting: General Surgery

## 2021-09-18 DIAGNOSIS — K3532 Acute appendicitis with perforation and localized peritonitis, without abscess: Secondary | ICD-10-CM

## 2021-09-21 ENCOUNTER — Ambulatory Visit
Admission: RE | Admit: 2021-09-21 | Discharge: 2021-09-21 | Disposition: A | Discharge status: Home / Self Care | Payer: Medicare HMO | Source: Ambulatory Visit | Attending: General Surgery | Admitting: General Surgery

## 2021-09-21 ENCOUNTER — Ambulatory Visit
Admission: RE | Admit: 2021-09-21 | Discharge: 2021-09-21 | Disposition: A | Discharge status: Home / Self Care | Payer: Medicare HMO | Source: Ambulatory Visit | Attending: Physician Assistant | Admitting: Physician Assistant

## 2021-09-21 DIAGNOSIS — K3532 Acute appendicitis with perforation and localized peritonitis, without abscess: Secondary | ICD-10-CM

## 2021-09-21 HISTORY — PX: IR RADIOLOGIST EVAL & MGMT: IMG5224

## 2021-09-21 MED ORDER — IOPAMIDOL (ISOVUE-300) INJECTION 61%
100.0000 mL | Freq: Once | INTRAVENOUS | Status: AC | PRN
Start: 2021-09-21 — End: 2021-09-21
  Administered 2021-09-21: 100 mL via INTRAVENOUS

## 2021-10-04 NOTE — Patient Instructions (Signed)
DUE TO COVID-19 ONLY TWO VISITORS  (aged 73 and older)  ARE ALLOWED TO COME WITH YOU AND STAY IN THE WAITING ROOM ONLY DURING PRE OP AND PROCEDURE.   **NO VISITORS ARE ALLOWED IN THE SHORT STAY AREA OR RECOVERY ROOM!!**  IF YOU WILL BE ADMITTED INTO THE HOSPITAL YOU ARE ALLOWED ONLY FOUR SUPPORT PEOPLE DURING VISITATION HOURS ONLY (7 AM -8PM)   The support person(s) must pass our screening, gel in and out, and wear a mask at all times, including in the patient's room. Patients must also wear a mask when staff or their support person are in the room. Visitors GUEST BADGE MUST BE WORN VISIBLY  One adult visitor may remain with you overnight and MUST be in the room by 8 P.M.     Your procedure is scheduled on: 10/12/21   Report to [facility] Main Entrance    Report to admitting at 6:30 AM   Call this number if you have problems the morning of surgery [phone]   Do not eat food :After Midnight.   After Midnight you may have the following liquids until 5:45 AM DAY OF SURGERY  Water Black Coffee (sugar ok, NO MILK/CREAM OR CREAMERS)  Tea (sugar ok, NO MILK/CREAM OR CREAMERS) regular and decaf                             Plain Jell-O (NO RED)                                           Fruit ices (not with fruit pulp, NO RED)                                     Popsicles (NO RED)                                                                  Juice: apple, WHITE grape, WHITE cranberry Sports drinks like Gatorade (NO RED) Clear broth(vegetable,chicken,beef)  FOLLOW BOWEL PREP AND ANY ADDITIONAL PRE OP INSTRUCTIONS YOU RECEIVED FROM YOUR SURGEON'S OFFICE!!!     Oral Hygiene is also important to reduce your risk of infection.                                    Remember - BRUSH YOUR TEETH THE MORNING OF SURGERY WITH YOUR REGULAR TOOTHPASTE   Take these medicines the morning of surgery with A SIP OF WATER: Tylenol, Carvedilol, Levothyroxine, Metoprolol   These are anesthesia  recommendations for holding your anticoagulants.  Please contact your prescribing physician to confirm IF it is safe to hold your anticoagulants for this length of time.   Eliquis Apixaban   72 hours   Xarelto Rivaroxaban   72 hours  Plavix Clopidogrel   120 hours  Pletal Cilostazol   120 hours  You may not have any metal on your body including hair pins, jewelry, and body piercing             Do not wear make-up, lotions, powders, perfumes, or deodorant  Do not wear nail polish including gel and S&S, artificial/acrylic nails, or any other type of covering on natural nails including finger and toenails. If you have artificial nails, gel coating, etc. that needs to be removed by a nail salon please have this removed prior to surgery or surgery may need to be canceled/ delayed if the surgeon/ anesthesia feels like they are unable to be safely monitored.   Do not shave  48 hours prior to surgery.    Do not bring valuables to the hospital. [place].   Contacts, dentures or bridgework may not be worn into surgery.   Bring small overnight bag day of surgery.   DO NOT [facility]. PHARMACY WILL DISPENSE MEDICATIONS LISTED ON YOUR MEDICATION LIST TO YOU DURING YOUR ADMISSION [facility]!    Special Instructions: Bring a copy of your healthcare power of attorney and living will documents         the day of surgery if you haven't scanned them before.              Please read over the following fact sheets you were given: IF YOU HAVE QUESTIONS ABOUT YOUR PRE-OP INSTRUCTIONS PLEASE CALL [facility] - Preparing for Surgery Before surgery, you can play an important role.  Because skin is not sterile, your skin needs to be as free of germs as possible.  You can reduce the number of germs on your skin by washing with CHG (chlorahexidine gluconate) soap before  surgery.  CHG is an antiseptic cleaner which kills germs and bonds with the skin to continue killing germs even after washing. Please DO NOT use if you have an allergy to CHG or antibacterial soaps.  If your skin becomes reddened/irritated stop using the CHG and inform your nurse when you arrive at Short Stay. Do not shave (including legs and underarms) for at least 48 hours prior to the first CHG shower.  You may shave your face/neck.  Please follow these instructions carefully:  1.  Shower with CHG Soap the night before surgery and the  morning of surgery.  2.  If you choose to wash your hair, wash your hair first as usual with your normal  shampoo.  3.  After you shampoo, rinse your hair and body thoroughly to remove the shampoo.                             4.  Use CHG as you would any other liquid soap.  You can apply chg directly to the skin and wash.  Gently with a scrungie or clean washcloth.  5.  Apply the CHG Soap to your body ONLY FROM THE NECK DOWN.   Do   not use on face/ open                           Wound or open sores. Avoid contact with eyes, ears mouth and   genitals (private parts).  Wash face,  Genitals (private parts) with your normal soap.             6.  Wash thoroughly, paying special attention to the area where your    surgery  will be performed.  7.  Thoroughly rinse your body with warm water from the neck down.  8.  DO NOT shower/wash with your normal soap after using and rinsing off the CHG Soap.                9.  Pat yourself dry with a clean towel.            10.  Wear clean pajamas.            11.  Place clean sheets on your bed the night of your first shower and do not  sleep with pets. Day of Surgery : Do not apply any lotions/deodorants the morning of surgery.  Please wear clean clothes to the hospital/surgery center.  FAILURE TO FOLLOW THESE INSTRUCTIONS MAY RESULT IN THE CANCELLATION OF YOUR SURGERY  PATIENT  SIGNATURE_________________________________  NURSE SIGNATURE__________________________________  ________________________________________________________________________

## 2021-10-04 NOTE — Progress Notes (Signed)
COVID Vaccine Completed: yes x1  Date of COVID positive in last 90 days:  PCP - Bernerd Limbo, MD Cardiologist -  Kirk Ruths, MD  Cardiac clearance by Christen Bame 09/11/21 in Epic   Chest x-ray - 11/12/20 Epic EKG - 12/28/20 Epic Stress Test -  ECHO - 04/05/20 Epic Cardiac Cath - 04/05/20 Epic Pacemaker/ICD device last checked: Spinal Cord Stimulator:  Bowel Prep -   Sleep Study -  CPAP -   Fasting Blood Sugar -  Checks Blood Sugar _____ times a day  Blood Thinner Instructions: Aspirin Instructions: Last Dose:  Activity level:  Can go up a flight of stairs and perform activities of daily living without stopping and without symptoms of chest pain or shortness of breath.  Able to exercise without symptoms  Unable to go up a flight of stairs without symptoms of     Anesthesia review:   Patient denies shortness of breath, fever, cough and chest pain at PAT appointment  Patient verbalized understanding of instructions that were given to them at the PAT appointment. Patient was also instructed that they will need to review over the PAT instructions again at home before surgery.

## 2021-10-05 ENCOUNTER — Encounter (HOSPITAL_COMMUNITY)
Admission: RE | Admit: 2021-10-05 | Discharge: 2021-10-05 | Disposition: A | Discharge status: Home / Self Care | Payer: Medicare HMO | Source: Ambulatory Visit | Attending: General Surgery | Admitting: General Surgery

## 2021-10-05 ENCOUNTER — Encounter (HOSPITAL_COMMUNITY): Payer: Self-pay

## 2021-10-05 VITALS — BP 137/65 | HR 70 | Temp 98.4°F | Resp 12 | Ht 67.0 in | Wt 110.8 lb

## 2021-10-05 DIAGNOSIS — Z01812 Encounter for preprocedural laboratory examination: Secondary | ICD-10-CM | POA: Insufficient documentation

## 2021-10-05 DIAGNOSIS — I4819 Other persistent atrial fibrillation: Secondary | ICD-10-CM | POA: Diagnosis not present

## 2021-10-05 HISTORY — DX: Acute myocardial infarction, unspecified: I21.9

## 2021-10-05 HISTORY — DX: Malignant (primary) neoplasm, unspecified: C80.1

## 2021-10-05 HISTORY — DX: Other complications of anesthesia, initial encounter: T88.59XA

## 2021-10-05 LAB — CBC
HCT: 36.9 % (ref 36.0–46.0)
Hemoglobin: 12 g/dL (ref 12.0–15.0)
MCH: 31.3 pg (ref 26.0–34.0)
MCHC: 32.5 g/dL (ref 30.0–36.0)
MCV: 96.3 fL (ref 80.0–100.0)
Platelets: 241 10*3/uL (ref 150–400)
RBC: 3.83 MIL/uL — ABNORMAL LOW (ref 3.87–5.11)
RDW: 12.7 % (ref 11.5–15.5)
WBC: 4.8 10*3/uL (ref 4.0–10.5)
nRBC: 0 % (ref 0.0–0.2)

## 2021-10-05 LAB — BASIC METABOLIC PANEL
Anion gap: 6 (ref 5–15)
BUN: 20 mg/dL (ref 8–23)
CO2: 26 mmol/L (ref 22–32)
Calcium: 9.3 mg/dL (ref 8.9–10.3)
Chloride: 106 mmol/L (ref 98–111)
Creatinine, Ser: 0.62 mg/dL (ref 0.44–1.00)
GFR, Estimated: >60 mL/min (ref >60)
Glucose, Bld: 100 mg/dL — ABNORMAL HIGH (ref 70–99)
Potassium: 4.2 mmol/L (ref 3.5–5.1)
Sodium: 138 mmol/L (ref 135–145)

## 2021-10-12 ENCOUNTER — Other Ambulatory Visit: Payer: Self-pay

## 2021-10-12 ENCOUNTER — Encounter (HOSPITAL_COMMUNITY): Payer: Self-pay | Admitting: General Surgery

## 2021-10-12 ENCOUNTER — Ambulatory Visit (HOSPITAL_BASED_OUTPATIENT_CLINIC_OR_DEPARTMENT_OTHER): Payer: Medicare HMO | Admitting: Certified Registered Nurse Anesthetist

## 2021-10-12 ENCOUNTER — Ambulatory Visit (HOSPITAL_COMMUNITY): Payer: Medicare HMO | Admitting: Physician Assistant

## 2021-10-12 ENCOUNTER — Encounter (HOSPITAL_COMMUNITY)
Admission: RE | Disposition: A | Discharge status: Home / Self Care | Payer: Self-pay | Source: Ambulatory Visit | Attending: General Surgery

## 2021-10-12 ENCOUNTER — Ambulatory Visit (HOSPITAL_COMMUNITY)
Admission: RE | Admit: 2021-10-12 | Discharge: 2021-10-13 | Disposition: A | Discharge status: Home / Self Care | Payer: Medicare HMO | Source: Ambulatory Visit | Attending: General Surgery | Admitting: General Surgery

## 2021-10-12 DIAGNOSIS — E039 Hypothyroidism, unspecified: Secondary | ICD-10-CM | POA: Insufficient documentation

## 2021-10-12 DIAGNOSIS — K3532 Acute appendicitis with perforation and localized peritonitis, without abscess: Secondary | ICD-10-CM

## 2021-10-12 DIAGNOSIS — Z87891 Personal history of nicotine dependence: Secondary | ICD-10-CM | POA: Diagnosis not present

## 2021-10-12 DIAGNOSIS — I252 Old myocardial infarction: Secondary | ICD-10-CM | POA: Diagnosis not present

## 2021-10-12 HISTORY — PX: LAPAROSCOPIC APPENDECTOMY: SHX408

## 2021-10-12 SURGERY — APPENDECTOMY, LAPAROSCOPIC
Anesthesia: General

## 2021-10-12 MED ORDER — CELECOXIB 200 MG PO CAPS
200.0000 mg | ORAL_CAPSULE | ORAL | Status: AC
  Administered 2021-10-12: 200 mg via ORAL
  Filled 2021-10-12: qty 1

## 2021-10-12 MED ORDER — DEXAMETHASONE SODIUM PHOSPHATE 10 MG/ML IJ SOLN
INTRAMUSCULAR | Status: DC | PRN
  Administered 2021-10-12: 8 mg via INTRAVENOUS

## 2021-10-12 MED ORDER — CARVEDILOL 6.25 MG PO TABS
6.2500 mg | ORAL_TABLET | Freq: Two times a day (BID) | ORAL | Status: DC
Start: 2021-10-12 — End: 2021-10-13
  Administered 2021-10-12 – 2021-10-13 (×2): 6.25 mg via ORAL
  Filled 2021-10-12 (×2): qty 1

## 2021-10-12 MED ORDER — CHLORHEXIDINE GLUCONATE 0.12 % MT SOLN
15.0000 mL | Freq: Once | OROMUCOSAL | Status: AC
  Administered 2021-10-12: 15 mL via OROMUCOSAL

## 2021-10-12 MED ORDER — POTASSIUM CHLORIDE IN NACL 20-0.9 MEQ/L-% IV SOLN
INTRAVENOUS | Status: DC
  Filled 2021-10-12 (×2): qty 1000

## 2021-10-12 MED ORDER — 0.9 % SODIUM CHLORIDE (POUR BTL) OPTIME
TOPICAL | Status: DC | PRN
  Administered 2021-10-12: 1000 mL

## 2021-10-12 MED ORDER — ORAL CARE MOUTH RINSE: 15.0000 mL | Freq: Once | OROMUCOSAL | Status: AC

## 2021-10-12 MED ORDER — SUCCINYLCHOLINE CHLORIDE 200 MG/10ML IV SOSY
PREFILLED_SYRINGE | INTRAVENOUS | Status: DC | PRN
  Administered 2021-10-12: 140 mg via INTRAVENOUS

## 2021-10-12 MED ORDER — ROCURONIUM BROMIDE 10 MG/ML (PF) SYRINGE
PREFILLED_SYRINGE | INTRAVENOUS | Status: DC | PRN
  Administered 2021-10-12: 30 mg via INTRAVENOUS

## 2021-10-12 MED ORDER — CIPROFLOXACIN IN D5W 400 MG/200ML IV SOLN
400.0000 mg | INTRAVENOUS | Status: AC
  Administered 2021-10-12: 400 mg via INTRAVENOUS
  Filled 2021-10-12: qty 200

## 2021-10-12 MED ORDER — ONDANSETRON 4 MG PO TBDP: 4.0000 mg | ORAL_TABLET | Freq: Four times a day (QID) | ORAL | Status: DC | PRN

## 2021-10-12 MED ORDER — ACETAMINOPHEN 500 MG PO TABS: 1000.0000 mg | ORAL_TABLET | Freq: Once | ORAL | Status: DC

## 2021-10-12 MED ORDER — FENTANYL CITRATE (PF) 100 MCG/2ML IJ SOLN
INTRAMUSCULAR | Status: AC
  Filled 2021-10-12: qty 2

## 2021-10-12 MED ORDER — BUPIVACAINE-EPINEPHRINE (PF) 0.25% -1:200000 IJ SOLN
INTRAMUSCULAR | Status: AC
  Filled 2021-10-12: qty 30

## 2021-10-12 MED ORDER — SUGAMMADEX SODIUM 200 MG/2ML IV SOLN
INTRAVENOUS | Status: DC | PRN
  Administered 2021-10-12: 200 mg via INTRAVENOUS

## 2021-10-12 MED ORDER — GABAPENTIN 300 MG PO CAPS
300.0000 mg | ORAL_CAPSULE | ORAL | Status: AC
  Administered 2021-10-12: 300 mg via ORAL
  Filled 2021-10-12: qty 1

## 2021-10-12 MED ORDER — CHLORHEXIDINE GLUCONATE CLOTH 2 % EX PADS: 6.0000 | MEDICATED_PAD | Freq: Once | CUTANEOUS | Status: DC

## 2021-10-12 MED ORDER — MIDAZOLAM HCL 5 MG/5ML IJ SOLN
INTRAMUSCULAR | Status: DC | PRN
  Administered 2021-10-12: 2 mg via INTRAVENOUS

## 2021-10-12 MED ORDER — ENOXAPARIN SODIUM 30 MG/0.3ML IJ SOSY
30.0000 mg | PREFILLED_SYRINGE | INTRAMUSCULAR | Status: DC
  Filled 2021-10-12: qty 0.3

## 2021-10-12 MED ORDER — LIDOCAINE HCL (CARDIAC) PF 100 MG/5ML IV SOSY
PREFILLED_SYRINGE | INTRAVENOUS | Status: DC | PRN
  Administered 2021-10-12: 40 mg via INTRATRACHEAL

## 2021-10-12 MED ORDER — BUPIVACAINE-EPINEPHRINE 0.25% -1:200000 IJ SOLN
INTRAMUSCULAR | Status: DC | PRN
  Administered 2021-10-12: 10 mL

## 2021-10-12 MED ORDER — EPHEDRINE SULFATE-NACL 50-0.9 MG/10ML-% IV SOSY
PREFILLED_SYRINGE | INTRAVENOUS | Status: DC | PRN
  Administered 2021-10-12: 5 mg via INTRAVENOUS

## 2021-10-12 MED ORDER — LACTATED RINGERS IV SOLN: INTRAVENOUS | Status: DC

## 2021-10-12 MED ORDER — FENTANYL CITRATE (PF) 250 MCG/5ML IJ SOLN
INTRAMUSCULAR | Status: DC | PRN
  Administered 2021-10-12 (×2): 50 ug via INTRAVENOUS

## 2021-10-12 MED ORDER — FENTANYL CITRATE PF 50 MCG/ML IJ SOSY
PREFILLED_SYRINGE | INTRAMUSCULAR | Status: AC
  Filled 2021-10-12: qty 1

## 2021-10-12 MED ORDER — PANTOPRAZOLE SODIUM 40 MG IV SOLR
40.0000 mg | Freq: Every day | INTRAVENOUS | Status: DC
  Administered 2021-10-12: 40 mg via INTRAVENOUS
  Filled 2021-10-12: qty 10

## 2021-10-12 MED ORDER — FENTANYL CITRATE PF 50 MCG/ML IJ SOSY
25.0000 ug | PREFILLED_SYRINGE | INTRAMUSCULAR | Status: DC | PRN
  Administered 2021-10-12: 50 ug via INTRAVENOUS

## 2021-10-12 MED ORDER — LEVOTHYROXINE SODIUM 50 MCG PO TABS
50.0000 ug | ORAL_TABLET | Freq: Every day | ORAL | Status: DC
  Administered 2021-10-13: 50 ug via ORAL
  Filled 2021-10-12: qty 1

## 2021-10-12 MED ORDER — HEMOSTATIC AGENTS (NO CHARGE) OPTIME
TOPICAL | Status: DC | PRN
  Administered 2021-10-12: 1 via TOPICAL

## 2021-10-12 MED ORDER — LIDOCAINE 20MG/ML (2%) 15 ML SYRINGE OPTIME
INTRAMUSCULAR | Status: DC | PRN
  Administered 2021-10-12: 1.5 mg/kg/h via INTRAVENOUS

## 2021-10-12 MED ORDER — NITROGLYCERIN 0.4 MG SL SUBL
0.4000 mg | SUBLINGUAL_TABLET | SUBLINGUAL | Status: DC | PRN
Start: 2021-10-12 — End: 2021-10-13

## 2021-10-12 MED ORDER — PROPOFOL 10 MG/ML IV BOLUS
INTRAVENOUS | Status: DC | PRN
  Administered 2021-10-12: 130 mg via INTRAVENOUS

## 2021-10-12 MED ORDER — MORPHINE SULFATE (PF) 2 MG/ML IV SOLN: 1.0000 mg | INTRAVENOUS | Status: DC | PRN

## 2021-10-12 MED ORDER — ALENDRONATE SODIUM 70 MG PO TABS: 70.0000 mg | ORAL_TABLET | ORAL | Status: DC

## 2021-10-12 MED ORDER — METOPROLOL SUCCINATE ER 25 MG PO TB24: 25.0000 mg | ORAL_TABLET | Freq: Every day | ORAL | Status: DC | PRN

## 2021-10-12 MED ORDER — ACETAMINOPHEN 500 MG PO TABS
1000.0000 mg | ORAL_TABLET | ORAL | Status: AC
  Administered 2021-10-12: 1000 mg via ORAL
  Filled 2021-10-12: qty 2

## 2021-10-12 MED ORDER — ONDANSETRON HCL 4 MG/2ML IJ SOLN
INTRAMUSCULAR | Status: DC | PRN
  Administered 2021-10-12: 4 mg via INTRAVENOUS

## 2021-10-12 MED ORDER — GLYCOPYRROLATE 0.2 MG/ML IJ SOLN
INTRAMUSCULAR | Status: DC | PRN
  Administered 2021-10-12: .2 mg via INTRAVENOUS

## 2021-10-12 MED ORDER — ONDANSETRON HCL 4 MG/2ML IJ SOLN: 4.0000 mg | Freq: Four times a day (QID) | INTRAMUSCULAR | Status: DC | PRN

## 2021-10-12 MED ORDER — MIDAZOLAM HCL 2 MG/2ML IJ SOLN
INTRAMUSCULAR | Status: AC
  Filled 2021-10-12: qty 2

## 2021-10-12 MED ORDER — LACTATED RINGERS IR SOLN
Status: DC | PRN
  Administered 2021-10-12: 1

## 2021-10-12 MED ORDER — OXYCODONE HCL 5 MG PO TABS: 5.0000 mg | ORAL_TABLET | ORAL | Status: DC | PRN

## 2021-10-12 SURGICAL SUPPLY — 37 items
APPLICATOR ARISTA FLEXITIP XL (MISCELLANEOUS) ×1 IMPLANT
APPLIER CLIP ROT 10 11.4 M/L (STAPLE) ×2
BAG COUNTER SPONGE SURGICOUNT (BAG) IMPLANT
CABLE HIGH FREQUENCY MONO STRZ (ELECTRODE) ×2 IMPLANT
CHLORAPREP W/TINT 26 (MISCELLANEOUS) ×2 IMPLANT
CLIP APPLIE ROT 10 11.4 M/L (STAPLE) IMPLANT
CUTTER FLEX LINEAR 45M (STAPLE) ×1 IMPLANT
DERMABOND ADVANCED (GAUZE/BANDAGES/DRESSINGS) ×1
DERMABOND ADVANCED .7 DNX12 (GAUZE/BANDAGES/DRESSINGS) ×1 IMPLANT
ELECT REM PT RETURN 15FT ADLT (MISCELLANEOUS) ×2 IMPLANT
ENDOLOOP SUT PDS II  0 18 (SUTURE)
ENDOLOOP SUT PDS II 0 18 (SUTURE) IMPLANT
GLOVE BIO SURGEON STRL SZ7.5 (GLOVE) ×2 IMPLANT
GOWN STRL REUS W/ TWL LRG LVL3 (GOWN DISPOSABLE) IMPLANT
GOWN STRL REUS W/TWL LRG LVL3 (GOWN DISPOSABLE)
HEMOSTAT ARISTA ABSORB 3G PWDR (HEMOSTASIS) ×1 IMPLANT
IRRIG SUCT STRYKERFLOW 2 WTIP (MISCELLANEOUS) ×2
IRRIGATION SUCT STRKRFLW 2 WTP (MISCELLANEOUS) ×1 IMPLANT
KIT BASIN OR (CUSTOM PROCEDURE TRAY) ×2 IMPLANT
KIT TURNOVER KIT A (KITS) IMPLANT
PENCIL SMOKE EVACUATOR (MISCELLANEOUS) IMPLANT
RELOAD 45 VASCULAR/THIN (ENDOMECHANICALS) IMPLANT
RELOAD STAPLE 45 2.5 WHT GRN (ENDOMECHANICALS) IMPLANT
RELOAD STAPLE 45 3.5 BLU ETS (ENDOMECHANICALS) IMPLANT
RELOAD STAPLE TA45 3.5 REG BLU (ENDOMECHANICALS) ×2 IMPLANT
SCISSORS LAP 5X35 DISP (ENDOMECHANICALS) ×1 IMPLANT
SET TUBE SMOKE EVAC HIGH FLOW (TUBING) ×2 IMPLANT
SHEARS HARMONIC ACE PLUS 36CM (ENDOMECHANICALS) ×2 IMPLANT
SPIKE FLUID TRANSFER (MISCELLANEOUS) ×1 IMPLANT
SUT MNCRL AB 4-0 PS2 18 (SUTURE) ×2 IMPLANT
SYS BAG RETRIEVAL 10MM (BASKET) ×2
SYSTEM BAG RETRIEVAL 10MM (BASKET) ×1 IMPLANT
TOWEL OR 17X26 10 PK STRL BLUE (TOWEL DISPOSABLE) ×2 IMPLANT
TRAY FOLEY MTR SLVR 16FR STAT (SET/KITS/TRAYS/PACK) IMPLANT
TRAY LAPAROSCOPIC (CUSTOM PROCEDURE TRAY) ×2 IMPLANT
TROCAR BALLN 12MMX100 BLUNT (TROCAR) ×2 IMPLANT
TROCAR Z-THREAD OPTICAL 5X100M (TROCAR) ×2 IMPLANT

## 2021-10-12 NOTE — Anesthesia Procedure Notes (Signed)
Procedure Name: Intubation Date/Time: 10/12/2021 8:56 AM  Performed by: Pilar Grammes, CRNAPre-anesthesia Checklist: Patient identified, Emergency Drugs available, Suction available, Patient being monitored and Timeout performed Patient Re-evaluated:Patient Re-evaluated prior to induction Oxygen Delivery Method: Circle system utilized Preoxygenation: Pre-oxygenation with 100% oxygen Induction Type: IV induction Ventilation: Mask ventilation without difficulty Laryngoscope Size: Miller and 2 Grade View: Grade I Tube type: Oral Tube size: 7.5 mm Number of attempts: 1 Airway Equipment and Method: Stylet Placement Confirmation: positive ETCO2, ETT inserted through vocal cords under direct vision, CO2 detector and breath sounds checked- equal and bilateral Secured at: 23 cm Tube secured with: Tape Dental Injury: Teeth and Oropharynx as per pre-operative assessment

## 2021-10-12 NOTE — Interval H&P Note (Signed)
History and Physical Interval Note:  10/12/2021 7:56 AM  Anita Donovan  has presented today for surgery, with the diagnosis of PERFORATED APPENDICITIS.  The various methods of treatment have been discussed with the patient and family. After consideration of risks, benefits and other options for treatment, the patient has consented to  Procedure(s): LAPAROSCOPIC APPENDECTOMY, POSSIBLE OPEN (N/A) as a surgical intervention.  The patient's history has been reviewed, patient examined, no change in status, stable for surgery.  I have reviewed the patient's chart and labs.  Questions were answered to the patient's satisfaction.     Autumn Messing III

## 2021-10-12 NOTE — Op Note (Signed)
10/12/2021  10:28 AM  PATIENT:  Anita Donovan  71 y.o. female  PRE-OPERATIVE DIAGNOSIS:  PERFORATED APPENDICITIS  POST-OPERATIVE DIAGNOSIS:  PERFORATED APPENDICITIS  PROCEDURE:  Procedure(s): LAPAROSCOPIC APPENDECTOMY (N/A)  SURGEON:  Surgeon(s) and Role:    * Jovita Kussmaul, MD - Primary  PHYSICIAN ASSISTANT:   ASSISTANTS: none   ANESTHESIA:   local and general  EBL:  20 mL   BLOOD ADMINISTERED:none  DRAINS: none   LOCAL MEDICATIONS USED:  MARCAINE     SPECIMEN:  Source of Specimen:  appendix  DISPOSITION OF SPECIMEN:  PATHOLOGY  COUNTS:  YES  TOURNIQUET:  * No tourniquets in log *  DICTATION: .Dragon Dictation  After informed consent was obtained patient was brought to the operating room placed in the supine position on the operating room table. After adequate induction of general anesthesia the patient's abdomen was prepped with ChloraPrep, allowed to dry, and draped in usual sterile manner. The area below the umbilicus was infiltrated with quarter percent Marcaine. A small incision was made with a 15 blade knife. This incision was carried down through the subcutaneous tissue bluntly with a hemostat and [employer] retractors until the linea alba was identified. The linea alba was incised with a 15 blade knife. Each side was grasped Coker clamps and elevated anteriorly. The preperitoneal space was probed bluntly with a hemostat until the peritoneum was opened and access was gained to the abdominal cavity. A 0 Vicryl purse string stitch was placed in the fascia surrounding the opening. A Hassan cannula was placed through the opening and anchored in place with the previously placed Vicryl purse string stitch. The laparoscope was placed through the [facility] cannula. The abdomen was insufflated with carbon dioxide without difficulty.  The abdomen was inspected and there were omental adhesions to the anterior abdominal wall.  I was able to avoid these with the placement of the  other 2 ports.  Next the suprapubic area was infiltrated with quarter percent Marcaine. A small incision was made with a 15 blade knife. A 5 mm port was placed bluntly through this incision into the abdominal cavity. A site was then chosen in the left upper quadrant for placement of a 5 mm port. The area was infiltrated with quarter percent Marcaine. A small stab incision was made with a 15 blade knife. A 5 mm port was placed bluntly through this incision and the abdominal cavity under direct vision. The laparoscope was then moved to the suprapubic port. Using a Glassman grasper and harmonic scalpel the right lower quadrant was inspected.  There were some adhesions around the right colon which I was able to take down sharply with the harmonic scalpel.  This allowed me to roll the right colon medially and in doing so I was able to identify the appendix.  The mesoappendix was taken down sharply with the harmonic scalpel.  Once the base of the appendix where it joined the cecum was identified and cleared of any tissue then a laparoscopic GIA blue load 6 row stapler was placed through the [facility] cannula. The stapler was placed across the base of the appendix clamped and fired thereby dividing the base of the appendix between staple lines.  The rest of the appendix was then able to be mobilized off the abdominal sidewall sharply with the harmonic scalpel and bluntly as well.  The drain was identified.  The drain was divided and removed.  A laparoscopic bag was then inserted through the [facility] cannula. The appendix was placed  within the bag and the bag was sealed. The abdomen was then irrigated with copious amounts of saline until the effluent was clear. No other abnormalities were noted. Arista was placed along the abdominal sidewall where there was general oozing from inflamed tissue.  The appendix and bag were removed with the [facility] cannula through the infraumbilical port without difficulty. The fascial defect was  closed with the previously placed Vicryl pursestring stitch as well as with another interrupted 0 Vicryl figure-of-eight stitch. The rest of the ports were removed under direct vision and were found to be hemostatic. The gas was allowed to escape. The skin incisions were closed with interrupted 4-0 Monocryl subcuticular stitches. Dermabond dressings were applied. The patient tolerated the procedure well. At the end of the case all needle sponge and instrument counts were correct. The patient was then awakened and taken to recovery in stable condition.   PLAN OF CARE: Admit for overnight observation  PATIENT DISPOSITION:  PACU - hemodynamically stable.   Delay start of Pharmacological VTE agent (>24hrs) due to surgical blood loss or risk of bleeding: no

## 2021-10-12 NOTE — Anesthesia Postprocedure Evaluation (Signed)
Anesthesia Post Note  Patient: Anita Donovan  Procedure(s) Performed: LAPAROSCOPIC APPENDECTOMY     Patient location during evaluation: PACU Anesthesia Type: General Level of consciousness: awake Pain management: pain level controlled Vital Signs Assessment: post-procedure vital signs reviewed and stable Respiratory status: spontaneous breathing Cardiovascular status: stable Postop Assessment: no apparent nausea or vomiting Anesthetic complications: no   No notable events documented.  Last Vitals:  Vitals:   10/12/21 1145 10/12/21 1208  BP: (!) 117/59 (!) 143/61  Pulse: (!) 49 (!) 50  Resp: 10 14  Temp: (!) 36.3 C   SpO2: 99% 99%    Last Pain:  Vitals:   10/12/21 1208  TempSrc:   PainSc: 1                  Alif Petrak

## 2021-10-12 NOTE — H&P (Signed)
PROVIDER: Landry Corporal, MD  MRN: [id number] DOB: [birth year] Subjective   Chief Complaint: Follow-up   History of Present Illness: Anita Donovan is a 71 y.o. female who is seen today for perforated appendicitis. The patient is a 71 year old white female who was hospitalized 3 weeks ago with perforated appendicitis. She was managed with percutaneous drainage and antibiotics and showed significant improvement. She still has her drain which is draining some purulent looking material. Since it will take 3 to 4 weeks to get to the operating room she is ready to start scheduling her definitive surgery.    Review of Systems: A complete review of systems was obtained from the patient. I have reviewed this information and discussed as appropriate with the patient. See HPI as well for other ROS.  ROS   Medical History: Past Medical History:  Diagnosis Date   Arrhythmia   COPD (chronic obstructive pulmonary disease) (CMS-HCC)   Thyroid disease   Patient Active Problem List  Diagnosis   Appendicitis with perforation   Past Surgical History:  Procedure Laterality Date   tonsilectomy    No Known Allergies  Current Outpatient Medications on File Prior to Visit  Medication Sig Dispense Refill   alendronate (FOSAMAX) 70 MG tablet Take 70 mg by mouth every 7 (seven) days   carvediloL (COREG) 6.25 MG tablet   coenzyme Q10-vitamin E 100-5 mg-unit Cap Take 1 capsule by mouth once daily   ELIQUIS 5 mg tablet   fluticasone-umeclidinium-vilanterol (TRELEGY ELLIPTA) 100-62.5-25 mcg inhaler Inhale 1 inhalation into the lungs once daily   levothyroxine (SYNTHROID) 50 MCG tablet Take by mouth   metoprolol succinate (TOPROL-XL) 25 MG XL tablet metoprolol succinate ER 25 mg tablet,extended release 24 hr   nitroGLYcerin (NITROSTAT) 0.4 MG SL tablet Place 0.4 mg under the tongue every 5 (five) minutes as needed   No current facility-administered medications on file prior to visit.   Family  History  Problem Relation Age of Onset   High blood pressure (Hypertension) Mother    Social History   Tobacco Use  Smoking Status Former   Types: Cigarettes  Smokeless Tobacco Never    Social History   Socioeconomic History   Marital status: Single  Tobacco Use   Smoking status: Former  Types: Cigarettes   Smokeless tobacco: Never   Objective:   There were no vitals filed for this visit.  There is no height or weight on file to calculate BMI.  Physical Exam Vitals reviewed.  Constitutional:  General: She is not in acute distress. Appearance: Normal appearance.  HENT:  Head: Normocephalic and atraumatic.  Right Ear: External ear normal.  Left Ear: External ear normal.  Nose: Nose normal.  Mouth/Throat:  Mouth: Mucous membranes are moist.  Pharynx: Oropharynx is clear.  Eyes:  General: No scleral icterus. Extraocular Movements: Extraocular movements intact.  Conjunctiva/sclera: Conjunctivae normal.  Pupils: Pupils are equal, round, and reactive to light.  Cardiovascular:  Rate and Rhythm: Normal rate and regular rhythm.  Pulses: Normal pulses.  Heart sounds: Normal heart sounds.  Pulmonary:  Effort: Pulmonary effort is normal. No respiratory distress.  Breath sounds: Normal breath sounds.  Abdominal:  General: Bowel sounds are normal.  Palpations: Abdomen is soft.  Tenderness: There is no abdominal tenderness.  Comments: The abdomen is soft and nontender. The drain is intact with a small amount of dark liquid in the bulb  Musculoskeletal:  General: No swelling, tenderness or deformity. Normal range of motion.  Cervical back: Normal range of motion  and neck supple.  Skin: General: Skin is warm and dry.  Coloration: Skin is not jaundiced.  Neurological:  General: No focal deficit present.  Mental Status: She is alert and oriented to person, place, and time.  Psychiatric:  Mood and Affect: Mood normal.  Behavior: Behavior normal.     Labs, Imaging  and Diagnostic Testing:  Assessment and Plan:   Diagnoses and all orders for this visit:  Appendicitis with perforation    The patient is about 3 weeks status post percutaneous drainage of an appendiceal abscess. She has made significant improvement and has no abdominal pain now. She will benefit from having an interval appendectomy in that 6 to 8-week timeframe. We will go ahead and fill out orders to schedule the surgery. I have discussed with her in detail the risks and benefits of the operation as well as some of the technical aspects and she understands and wishes to proceed. I will also switch her oral antibiotic to a low-dose of Cipro to try to prevent any flareups between now and the time of surgery.

## 2021-10-12 NOTE — Transfer of Care (Signed)
Immediate Anesthesia Transfer of Care Note  Patient: Anita Donovan  Procedure(s) Performed: LAPAROSCOPIC APPENDECTOMY  Patient Location: PACU  Anesthesia Type:General  Level of Consciousness: sedated, drowsy and pateint uncooperative  Airway & Oxygen Therapy: Patient connected to face mask oxygen  Post-op Assessment: Report given to RN and Post -op Vital signs reviewed and stable  Post vital signs: stable  Last Vitals:  Vitals Value Taken Time  BP 144/68 10/12/21 1032  Temp    Pulse 54 10/12/21 1035  Resp 13 10/12/21 1035  SpO2 100 % 10/12/21 1035  Vitals shown include unvalidated device data.  Last Pain:  Vitals:   10/12/21 0658  TempSrc:   PainSc: 0-No pain         Complications: No notable events documented.

## 2021-10-12 NOTE — Anesthesia Preprocedure Evaluation (Addendum)
Anesthesia Evaluation  Patient identified by MRN, date of birth, ID band Patient awake    Reviewed: Allergy & Precautions, NPO status , Patient's Chart, lab work & pertinent test results  Airway Mallampati: II  TM Distance: >3 FB     Dental   Pulmonary former smoker,    breath sounds clear to auscultation       Cardiovascular + Past MI   Rhythm:Regular Rate:Normal     Neuro/Psych    GI/Hepatic Neg liver ROS, History noted Dr. Nyoka Cowden   Endo/Other  Hypothyroidism   Renal/GU negative Renal ROS     Musculoskeletal   Abdominal   Peds  Hematology   Anesthesia Other Findings   Reproductive/Obstetrics                             Anesthesia Physical Anesthesia Plan  ASA: 3  Anesthesia Plan: General   Post-op Pain Management:    Induction: Intravenous  PONV Risk Score and Plan: 3 and Ondansetron, Dexamethasone and Midazolam  Airway Management Planned: Oral ETT  Additional Equipment:   Intra-op Plan:   Post-operative Plan: Extubation in OR  Informed Consent: I have reviewed the patients History and Physical, chart, labs and discussed the procedure including the risks, benefits and alternatives for the proposed anesthesia with the patient or authorized representative who has indicated his/her understanding and acceptance.     Dental advisory given  Plan Discussed with: CRNA and Anesthesiologist  Anesthesia Plan Comments:         Anesthesia Quick Evaluation

## 2021-10-13 ENCOUNTER — Encounter (HOSPITAL_COMMUNITY): Payer: Self-pay | Admitting: General Surgery

## 2021-10-13 DIAGNOSIS — K3532 Acute appendicitis with perforation and localized peritonitis, without abscess: Secondary | ICD-10-CM | POA: Diagnosis not present

## 2021-10-13 LAB — SURGICAL PATHOLOGY

## 2021-10-13 MED ORDER — OXYCODONE HCL 5 MG PO TABS: 5.0000 mg | ORAL_TABLET | Freq: Four times a day (QID) | ORAL | 0 refills | Status: DC | PRN

## 2021-10-13 NOTE — Progress Notes (Signed)
1 Day Post-Op   Subjective/Chief Complaint: No complaints other than some soreness   Objective: Vital signs in last 24 hours: Temp:  [95.4 F (35.2 C)-98.6 F (37 C)] 98.6 F (37 C) (07/14 0524) Pulse Rate:  [46-58] 50 (07/14 0524) Resp:  [10-21] 18 (07/14 0524) BP: (91-144)/(46-69) 107/47 (07/14 0524) SpO2:  [95 %-100 %] 97 % (07/14 0524) Last BM Date : 10/11/21  Intake/Output from previous day: 07/13 0701 - 07/14 0700 In: 1483.2 [P.O.:410; I.V.:873.2; IV Piggyback:200] Out: 190 [Urine:170; Blood:20] Intake/Output this shift: No intake/output data recorded.  General appearance: alert and cooperative Resp: clear to auscultation bilaterally Cardio: regular rate and rhythm GI: soft, appropriately tender  Lab Results:  No results for input(s): "WBC", "HGB", "HCT", "PLT" in the last 72 hours. BMET No results for input(s): "NA", "K", "CL", "CO2", "GLUCOSE", "BUN", "CREATININE", "CALCIUM" in the last 72 hours. PT/INR No results for input(s): "LABPROT", "INR" in the last 72 hours. ABG No results for input(s): "PHART", "HCO3" in the last 72 hours.  Invalid input(s): "PCO2", "PO2"  Studies/Results: No results found.  Anti-infectives: Anti-infectives (From admission, onward)    Start     Dose/Rate Route Frequency Ordered Stop   10/12/21 0645  ciprofloxacin (CIPRO) IVPB 400 mg        400 mg 200 mL/hr over 60 Minutes Intravenous On call to O.R. 10/12/21 0644 10/12/21 0935       Assessment/Plan: s/p Procedure(s): LAPAROSCOPIC APPENDECTOMY (N/A) Advance diet Discharge  LOS: 0 days    Autumn Messing III 10/13/2021

## 2021-10-13 NOTE — Progress Notes (Signed)
Reviewed written d/c instructions w pt and all questions answered. She verbalized understanding. D/C via w/c w all belongings in stable condition. 

## 2021-10-13 NOTE — Progress Notes (Signed)
Transition of Care [facility]) Screening Note  Patient Details  Name: Anita Donovan Date of Birth: February 03, 1951  Transition of Care [facility]) CM/SW Contact:    Sherie Don, LCSW Phone Number: 10/13/2021, 10:38 AM  Transition of Care Department [facility]) has reviewed patient and no TOC needs have been identified at this time. We will continue to monitor patient advancement through interdisciplinary progression rounds. If new patient transition needs arise, please place a TOC consult.

## 2021-10-16 ENCOUNTER — Other Ambulatory Visit: Payer: Self-pay | Admitting: General Practice

## 2021-11-10 ENCOUNTER — Encounter: Payer: Self-pay | Admitting: Nurse Practitioner

## 2021-11-10 ENCOUNTER — Ambulatory Visit: Payer: Medicare HMO | Admitting: Nurse Practitioner

## 2021-11-10 VITALS — BP 116/60 | Resp 20 | Ht 67.0 in | Wt 116.2 lb

## 2021-11-10 DIAGNOSIS — I252 Old myocardial infarction: Secondary | ICD-10-CM

## 2021-11-10 DIAGNOSIS — I480 Paroxysmal atrial fibrillation: Secondary | ICD-10-CM

## 2021-11-10 DIAGNOSIS — R002 Palpitations: Secondary | ICD-10-CM

## 2021-11-10 DIAGNOSIS — I471 Supraventricular tachycardia: Secondary | ICD-10-CM

## 2021-11-10 NOTE — Patient Instructions (Signed)
Medication Instructions:  ?Your physician recommends that you continue on your current medications as directed. Please refer to the Current Medication list given to you today.  ? ? ?*If you need a refill on your cardiac medications before your next appointment, please call your pharmacy* ? ? ?Lab Work: ?NONE ordered at this time of appointment  ? ? ?If you have labs (blood work) drawn today and your tests are completely normal, you will receive your results only by: ?MyChart Message (if you have MyChart) OR ?A paper copy in the mail ?If you have any lab test that is abnormal or we need to change your treatment, we will call you to review the results. ? ? ?Testing/Procedures: ?NONE ordered at this time of appointment  ? ? ? ?Follow-Up: ?At [facility], you and your health needs are our priority.  As part of our continuing mission to provide you with exceptional heart care, we have created designated Provider Care Teams.  These Care Teams include your primary Cardiologist (physician) and Advanced Practice Providers (APPs -  Physician Assistants and Nurse Practitioners) who all work together to provide you with the care you need, when you need it. ? ?We recommend signing up for the patient portal called "MyChart".  Sign up information is provided on this After Visit Summary.  MyChart is used to connect with patients for Virtual Visits (Telemedicine).  Patients are able to view lab/test results, encounter notes, upcoming appointments, etc.  Non-urgent messages can be sent to your provider as well.   ?To learn more about what you can do with MyChart, go to [email or web address].   ? ?Your next appointment:   ? 6 months ? ?The format for your next appointment:   ?In Person ? ?Provider:   ?Brian Crenshaw, MD   ? ? ?Other Instructions ? ? ?Important Information About Sugar ? ? ? ? ? ? ?

## 2021-11-10 NOTE — Progress Notes (Signed)
Office Visit    Patient Name: Anita Donovan Date of Encounter: 11/10/2021  Primary Care Provider:  Bernerd Limbo, MD Primary Cardiologist:  Kirk Ruths, MD  Chief Complaint    71 year old female with a history of palpitations, paroxysml atrial fibrillation s/p DCCV on Eliquis, NSTEMI with torturous coronary arteries, no obstructive CAD, possible coronary vasospasm, and possible SVT who presents for follow-up related to atrial fibrillation.  Past Medical History    Past Medical History:  Diagnosis Date   Allergy    Arthritis    knee right   Cancer ([place])    basal cell carcimona   Complication of anesthesia    slow to wake up suring colonoscopy   Multiple thyroid nodules    Myocardial infarction [facility])    Osteoporosis    Persistent atrial fibrillation ([place]) 12/20/2020   SVT (supraventricular tachycardia) ([place])    Past Surgical History:  Procedure Laterality Date   CARDIOVERSION N/A 12/20/2020   Procedure: CARDIOVERSION;  Surgeon: Skeet Latch, MD;  Location: [place];  Service: Cardiovascular;  Laterality: N/A;   COLONOSCOPY     IR RADIOLOGIST EVAL & MGMT  09/21/2021   LAPAROSCOPIC APPENDECTOMY N/A 10/12/2021   Procedure: LAPAROSCOPIC APPENDECTOMY;  Surgeon: Jovita Kussmaul, MD;  Location: [facility];  Service: General;  Laterality: N/A;   LEFT HEART CATH AND CORONARY ANGIOGRAPHY N/A 04/05/2020   Procedure: LEFT HEART CATH AND CORONARY ANGIOGRAPHY;  Surgeon: Belva Crome, MD;  Location: [place] CV [facility];  Service: Cardiovascular;  Laterality: N/A;   MOHS SURGERY     POLYPECTOMY     TONSILLECTOMY      Allergies  No Known Allergies  History of Present Illness    71 year old female with the above past medical history including palpitations, paroxysmal atrial fibrillation s/p DCCV on Eliquis, NSTEMI with torturous coronary arteries, no obstructive CAD, possible coronary vasospasm, and possible SVT.  She was initially evaluated by Dr. Rollene Fare in June 2011  following an episode of palpitations requiring evaluation in the ED.  There were no rhythm strips available it was thought she may have had episodes of SVT.  She was hospitalized in January 2020 with a mildly elevated troponin and chest pain.  Echocardiogram showed normal LV function.  Cardiac catheterization showed vessel tortuosity but no obstructive coronary disease.  Her symptoms were felt to possibly be secondary to vasospasm.  She was later diagnosed with atrial fibrillation in the setting of palpitations and underwent successful DCCV in September 2022.  She is on Eliquis.  She was last seen virtually for preoperative cardiac evaluation on 09/11/2021 and was stable from a cardiac standpoint.  She underwent laparoscopic appendectomy on 10/12/2021.  She presents today for follow-up.  Since her last visit she has been stable from a cardiac standpoint.  Denies any palpitations, dyspnea, denies symptoms concerning for angina.  She has noted some ongoing bloating since her appendectomy (she is following with general surgery), otherwise, she reports feeling well and denies any new concerns today.  Home Medications    Current Outpatient Medications  Medication Sig Dispense Refill   acetaminophen (TYLENOL) 325 MG tablet Take 325 mg by mouth every 6 (six) hours as needed for moderate pain.     alendronate (FOSAMAX) 70 MG tablet Take 70 mg by mouth every Monday.  3   apixaban (ELIQUIS) 5 MG TABS tablet TAKE 1 TABLET(5 MG) BY MOUTH TWICE DAILY 60 tablet 5   b complex vitamins capsule Take 1 capsule by mouth daily.  Calcium Carbonate-Vitamin D (CALTRATE 600+D PO) Take 1 capsule by mouth daily.     carvedilol (COREG) 6.25 MG tablet TAKE 1 TABLET(6.25 MG) BY MOUTH TWICE DAILY 180 tablet 2   diclofenac Sodium (VOLTAREN) 1 % GEL Apply 1 Application topically 4 (four) times daily as needed (pain).     fluticasone (FLONASE) 50 MCG/ACT nasal spray Place 1 spray into both nostrils daily as needed for allergies or  rhinitis.     Fluticasone-Umeclidin-Vilant 100-62.5-25 MCG/INH AEPB Inhale 1 puff into the lungs daily.     levothyroxine (SYNTHROID) 50 MCG tablet Take 50 mcg by mouth daily before breakfast.     metoprolol succinate (TOPROL-XL) 25 MG 24 hr tablet Take 25 mg by mouth daily as needed (rapid heart rate).     Multiple Vitamins-Minerals (CENTRUM SILVER ADULT 50+ PO) Take 1 tablet by mouth daily.     nitroGLYCERIN (NITROSTAT) 0.4 MG SL tablet Place 1 tablet (0.4 mg total) under the tongue every 5 (five) minutes as needed for chest pain. 25 tablet 3   No current facility-administered medications for this visit.     Review of Systems    She denies chest pain, palpitations, dyspnea, pnd, orthopnea, n, v, dizziness, syncope, edema, weight gain, or early satiety. All other systems reviewed and are otherwise negative except as noted above.   Physical Exam    VS:  BP 116/60   Resp 20   Ht '5\' 7"'$  (1.702 m)   Wt 116 lb 3.2 oz (52.7 kg)   SpO2 100%   BMI 18.20 kg/m   GEN: Well nourished, well developed, in no acute distress. HEENT: normal. Neck: Supple, no JVD, carotid bruits, or masses. Cardiac: RRR, no murmurs, rubs, or gallops. No clubbing, cyanosis, edema.  Radials/DP/PT 2+ and equal bilaterally.  Respiratory:  Respirations regular and unlabored, clear to auscultation bilaterally. GI: Soft, nontender, nondistended, BS + x 4. MS: no deformity or atrophy. Skin: warm and dry, no rash. Neuro:  Strength and sensation are intact. Psych: Normal affect.  Accessory Clinical Findings    ECG personally reviewed by me today -NSR, 61 bpm- no acute changes.  Lab Results  Component Value Date   WBC 4.8 10/05/2021   HGB 12.0 10/05/2021   HCT 36.9 10/05/2021   MCV 96.3 10/05/2021   PLT 241 10/05/2021   Lab Results  Component Value Date   CREATININE 0.62 10/05/2021   BUN 20 10/05/2021   NA 138 10/05/2021   K 4.2 10/05/2021   CL 106 10/05/2021   CO2 26 10/05/2021   Lab Results  Component  Value Date   ALT 22 08/05/2021   AST 24 08/05/2021   ALKPHOS 58 08/05/2021   BILITOT 1.9 (H) 08/05/2021   Lab Results  Component Value Date   CHOL 161 04/05/2020   HDL 66 04/05/2020   LDLCALC 85 04/05/2020   TRIG 51 04/05/2020   CHOLHDL 2.4 04/05/2020    Lab Results  Component Value Date   HGBA1C 5.1 04/05/2020    Assessment & Plan    1. Paroxysmal atrial fibrillation: S/p DCCV in 12/2020. Maintaining NSR. Continue Eliquis, carvedilol, prn metoprolol.  2. History of possible NSTEMI: Cath in 04/2018 showed vessel tortuosity but no obstructive coronary disease.  Her symptoms were felt to possibly be secondary to vasospasm. Stable with no anginal symptoms. No indication for ischemic evaluation.   3. Palpitations: Denies any recent palpitations, continue carvedilol, and metoprolol as above.  4. History of possible SVT: EKG today shows sinus rhythm, 61 bpm,  continue beta-blocker as above.  5. Disposition: Follow-up in 6 months.   Lenna Sciara, NP 11/10/2021, 9:08 AM

## 2022-02-19 ENCOUNTER — Telehealth: Payer: Self-pay | Admitting: Cardiology

## 2022-02-19 MED ORDER — APIXABAN 5 MG PO TABS
ORAL_TABLET | ORAL | 5 refills | Status: DC
Start: 2022-02-19 — End: 2022-08-10

## 2022-02-19 MED ORDER — TRELEGY ELLIPTA 100-62.5-25 MCG/ACT IN AEPB: 1.0000 | INHALATION_SPRAY | Freq: Every day | RESPIRATORY_TRACT | 3 refills | Status: DC

## 2022-02-19 NOTE — Telephone Encounter (Signed)
Pt c/o medication issue:  1. Name of Medication: Fluticasone-Umeclidin-Vilant 100-62.5-25 MCG/INH AEPB apixaban (ELIQUIS) 5 MG TABS tablet  2. How are you currently taking this medication (dosage and times per day)? As prescribed  3. Are you having a reaction (difficulty breathing--STAT)?   4. What is your medication issue? Pt states her pharmacy needs this approved through her doctor.

## 2022-02-19 NOTE — Telephone Encounter (Signed)
Pt needs refills, refill sent to requested pharmacy

## 2022-04-12 ENCOUNTER — Other Ambulatory Visit: Payer: Self-pay | Admitting: Cardiology

## 2022-04-18 NOTE — Progress Notes (Signed)
HPI: FU atrial fibrillation. The patient was seen by Dr. Rollene Fare in June 2011 following an episode of palpitations requiring evaluation in the emergency room. There were apparently no rhythm strips available and he felt as though she may be having SVT. Abdominal ultrasound March 2016 showed no aneurysm. Admitted January 2022 with mildly elevated troponin and chest pain.  Echocardiogram January 2022 showed normal LV function.  Cardiac catheterization January 2022 showed vessel tortuosity but no obstructive coronary disease.  Symptoms felt possibly secondary to vasospasm.  Previously seen in the office with palpitations and found to be in atrial fibrillation.  Patient had successful cardioversion on December 20, 2020.  Since last seen, she denies dyspnea, chest pain, palpitations or syncope.  No bleeding.  Current Outpatient Medications  Medication Sig Dispense Refill   acetaminophen (TYLENOL) 325 MG tablet Take 325 mg by mouth every 6 (six) hours as needed for moderate pain.     alendronate (FOSAMAX) 70 MG tablet Take 70 mg by mouth every Monday.  3   apixaban (ELIQUIS) 5 MG TABS tablet TAKE 1 TABLET(5 MG) BY MOUTH TWICE DAILY 60 tablet 5   b complex vitamins capsule Take 1 capsule by mouth daily.     Calcium Carbonate-Vitamin D (CALTRATE 600+D PO) Take 1 capsule by mouth daily.     carvedilol (COREG) 6.25 MG tablet TAKE 1 TABLET(6.25 MG) BY MOUTH TWICE DAILY 180 tablet 2   diclofenac Sodium (VOLTAREN) 1 % GEL Apply 1 Application topically 4 (four) times daily as needed (pain).     fluticasone (FLONASE) 50 MCG/ACT nasal spray Place 1 spray into both nostrils daily as needed for allergies or rhinitis.     Fluticasone-Umeclidin-Vilant (TRELEGY ELLIPTA) 100-62.5-25 MCG/ACT AEPB Inhale 1 puff into the lungs daily. 1 each 3   levothyroxine (SYNTHROID) 50 MCG tablet Take 50 mcg by mouth daily before breakfast.     metoprolol succinate (TOPROL-XL) 25 MG 24 hr tablet Take 25 mg by mouth daily as  needed (rapid heart rate).     Multiple Vitamins-Minerals (CENTRUM SILVER ADULT 50+ PO) Take 1 tablet by mouth daily.     nitroGLYCERIN (NITROSTAT) 0.4 MG SL tablet Place 1 tablet (0.4 mg total) under the tongue every 5 (five) minutes as needed for chest pain. 25 tablet 3   No current facility-administered medications for this visit.     Past Medical History:  Diagnosis Date   Allergy    Arthritis    knee right   Cancer ([place])    basal cell carcimona   Complication of anesthesia    slow to wake up suring colonoscopy   Multiple thyroid nodules    Myocardial infarction [facility])    Osteoporosis    Persistent atrial fibrillation ([place]) 12/20/2020   SVT (supraventricular tachycardia)     Past Surgical History:  Procedure Laterality Date   CARDIOVERSION N/A 12/20/2020   Procedure: CARDIOVERSION;  Surgeon: Skeet Latch, MD;  Location: [facility];  Service: Cardiovascular;  Laterality: N/A;   COLONOSCOPY     IR RADIOLOGIST EVAL & MGMT  09/21/2021   LAPAROSCOPIC APPENDECTOMY N/A 10/12/2021   Procedure: LAPAROSCOPIC APPENDECTOMY;  Surgeon: Jovita Kussmaul, MD;  Location: [facility];  Service: General;  Laterality: N/A;   LEFT HEART CATH AND CORONARY ANGIOGRAPHY N/A 04/05/2020   Procedure: LEFT HEART CATH AND CORONARY ANGIOGRAPHY;  Surgeon: Belva Crome, MD;  Location: [facility];  Service: Cardiovascular;  Laterality: N/A;   MOHS SURGERY     POLYPECTOMY  TONSILLECTOMY      Social History   Socioeconomic History   Marital status: Widowed    Spouse name: Not on file   Number of children: Not on file   Years of education: Not on file   Highest education level: Not on file  Occupational History   Not on file  Tobacco Use   Smoking status: Former    Types: Cigarettes    Quit date: 04/08/2017    Years since quitting: 5.0   Smokeless tobacco: Never  Vaping Use   Vaping Use: Never used  Substance and Sexual Activity   Alcohol use: Not Currently    Comment: 2 glasses  per night   Drug use: Never   Sexual activity: Not on file  Other Topics Concern   Not on file  Social History Narrative   Not on file   Social Determinants of Health   Financial Resource Strain: Not on file  Food Insecurity: Not on file  Transportation Needs: Not on file  Physical Activity: Not on file  Stress: Not on file  Social Connections: Not on file  Intimate Partner Violence: Not on file    Family History  Problem Relation Age of Onset   Heart disease Mother        Stents   Colon cancer Neg Hx    Colon polyps Neg Hx    Esophageal cancer Neg Hx    Rectal cancer Neg Hx    Stomach cancer Neg Hx     ROS: no fevers or chills, productive cough, hemoptysis, dysphasia, odynophagia, melena, hematochezia, dysuria, hematuria, rash, seizure activity, orthopnea, PND, pedal edema, claudication. Remaining systems are negative.  Physical Exam: Well-developed well-nourished in no acute distress.  Skin is warm and dry.  HEENT is normal.  Neck is supple.  Chest is clear to auscultation with normal expansion.  Cardiovascular exam is regular rate and rhythm.  Abdominal exam nontender or distended. No masses palpated. Extremities show no edema. neuro grossly intact  ECG- personally reviewed  A/P  1 paroxysmal atrial fibrillation-patient remains in sinus rhythm.  Continue coreg and apixaban.  Can consider antiarrhythmic versus referral for ablation in the future if she has more frequent episodes.  2 history of possible non-ST elevation myocardial infarction-note catheterization at that time revealed no obstructive coronary disease.  Felt possibly secondary to vasospasm.  3 question history of SVT-continue beta-blocker.  No recent palpitations.  4 palpitations-continue beta-blocker.  Kirk Ruths, MD

## 2022-05-02 ENCOUNTER — Ambulatory Visit: Payer: Medicare HMO | Attending: Cardiology | Admitting: Cardiology

## 2022-05-02 ENCOUNTER — Encounter: Payer: Self-pay | Admitting: Cardiology

## 2022-05-02 VITALS — BP 110/58 | HR 60 | Ht 66.0 in | Wt 122.2 lb

## 2022-05-02 DIAGNOSIS — R002 Palpitations: Secondary | ICD-10-CM

## 2022-05-02 DIAGNOSIS — I471 Supraventricular tachycardia, unspecified: Secondary | ICD-10-CM

## 2022-05-02 DIAGNOSIS — I480 Paroxysmal atrial fibrillation: Secondary | ICD-10-CM | POA: Diagnosis not present

## 2022-05-02 NOTE — Patient Instructions (Signed)
  Follow-Up: At [facility], you and your health needs are our priority.  As part of our continuing mission to provide you with exceptional heart care, we have created designated Provider Care Teams.  These Care Teams include your primary Cardiologist (physician) and Advanced Practice Providers (APPs -  Physician Assistants and Nurse Practitioners) who all work together to provide you with the care you need, when you need it.  We recommend signing up for the patient portal called "MyChart".  Sign up information is provided on this After Visit Summary.  MyChart is used to connect with patients for Virtual Visits (Telemedicine).  Patients are able to view lab/test results, encounter notes, upcoming appointments, etc.  Non-urgent messages can be sent to your provider as well.   To learn more about what you can do with MyChart, go to [email or web address].    Your next appointment:   12 month(s)  Provider:   Brian Crenshaw, MD     

## 2022-05-18 ENCOUNTER — Other Ambulatory Visit: Payer: Self-pay | Admitting: Cardiology

## 2022-06-23 ENCOUNTER — Other Ambulatory Visit: Payer: Self-pay | Admitting: Cardiology

## 2022-07-25 ENCOUNTER — Other Ambulatory Visit: Payer: Self-pay | Admitting: Cardiology

## 2022-08-10 ENCOUNTER — Other Ambulatory Visit: Payer: Self-pay | Admitting: Cardiology

## 2022-08-10 DIAGNOSIS — I4819 Other persistent atrial fibrillation: Secondary | ICD-10-CM

## 2022-08-10 NOTE — Telephone Encounter (Signed)
Eliquis 5mg  refill request received. Patient is 72 years old, weight-55.4kg, Crea- 0.62 on 10/05/21, Diagnosis-Afib, and last seen by Dr. Jens Som on 05/02/22. Dose is appropriate based on dosing criteria. Will send in refill to requested pharmacy.

## 2022-08-25 ENCOUNTER — Other Ambulatory Visit: Payer: Self-pay | Admitting: Cardiology

## 2022-09-07 IMAGING — CR DG CHEST 2V
2 series · 2 of 2 positions shown · non-contrast
Comparison: 09/10/2009

CLINICAL DATA: Chest pain for 1 day, SVT, previous tobacco abuse

EXAM:
CHEST - 2 VIEW

[chest pa]
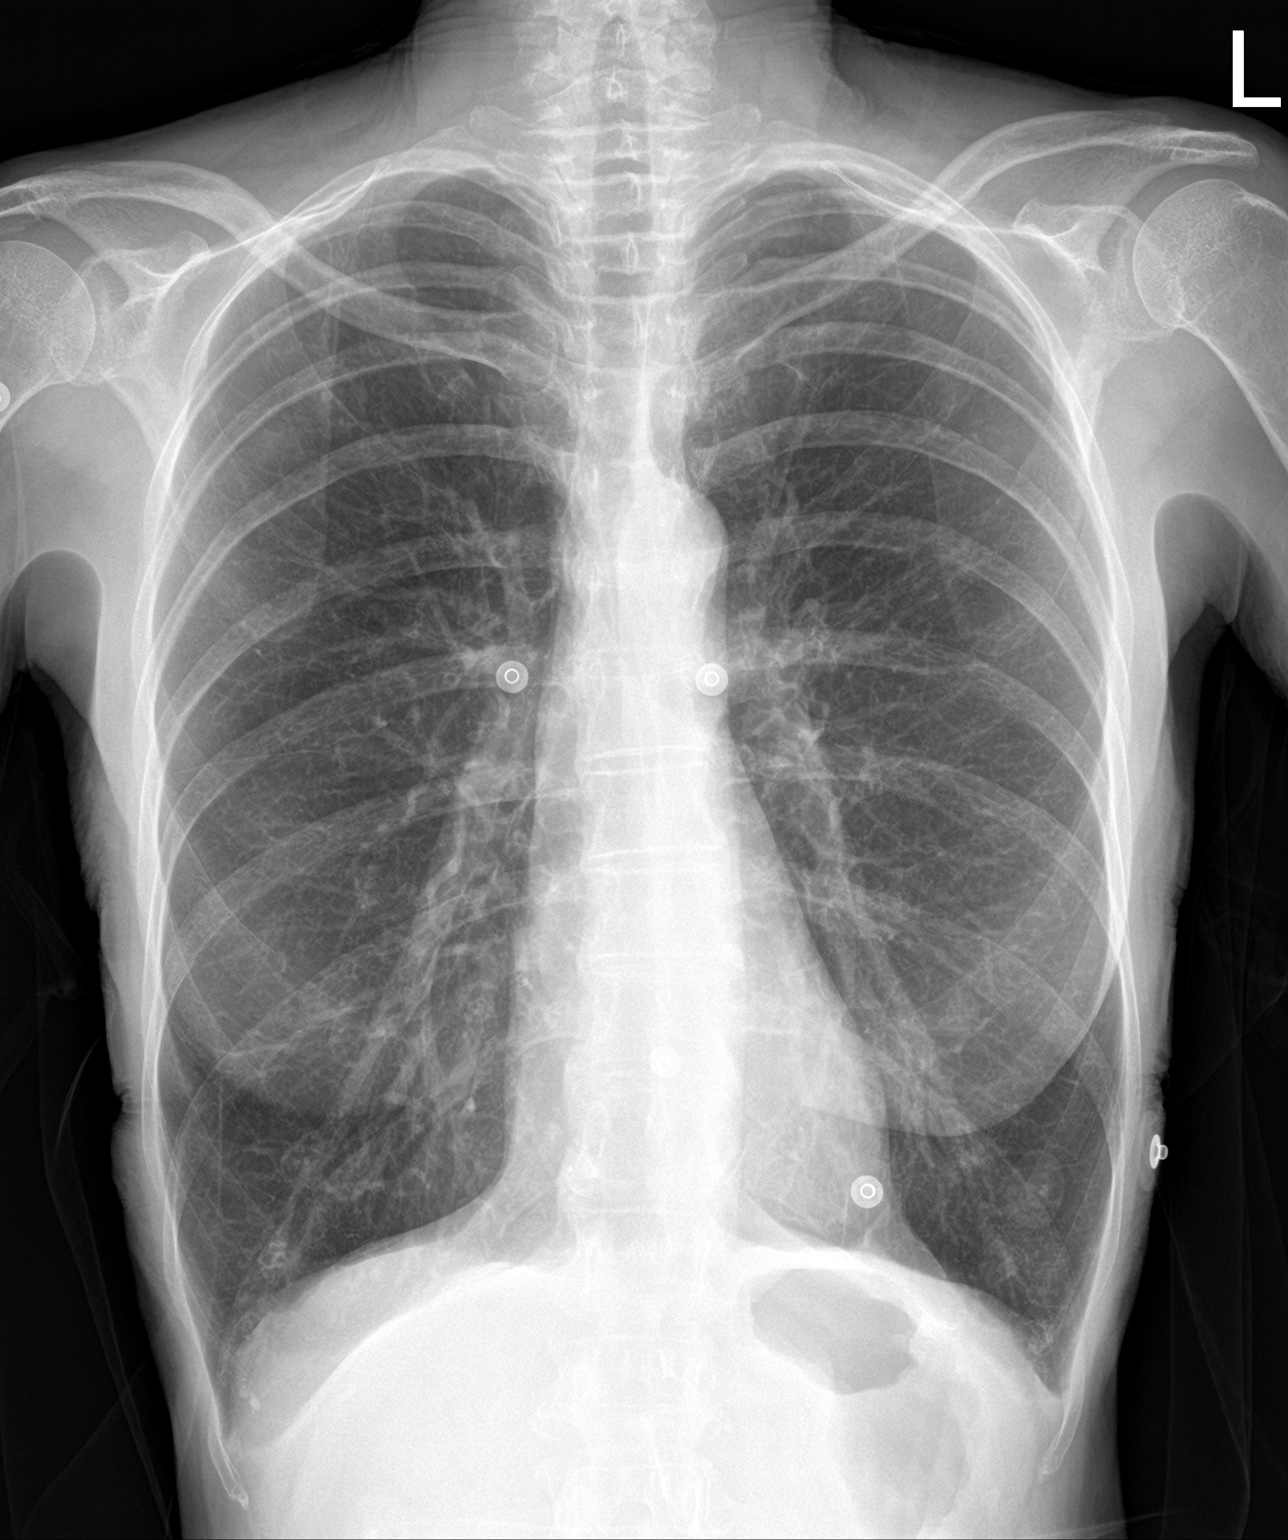

[chest lat]
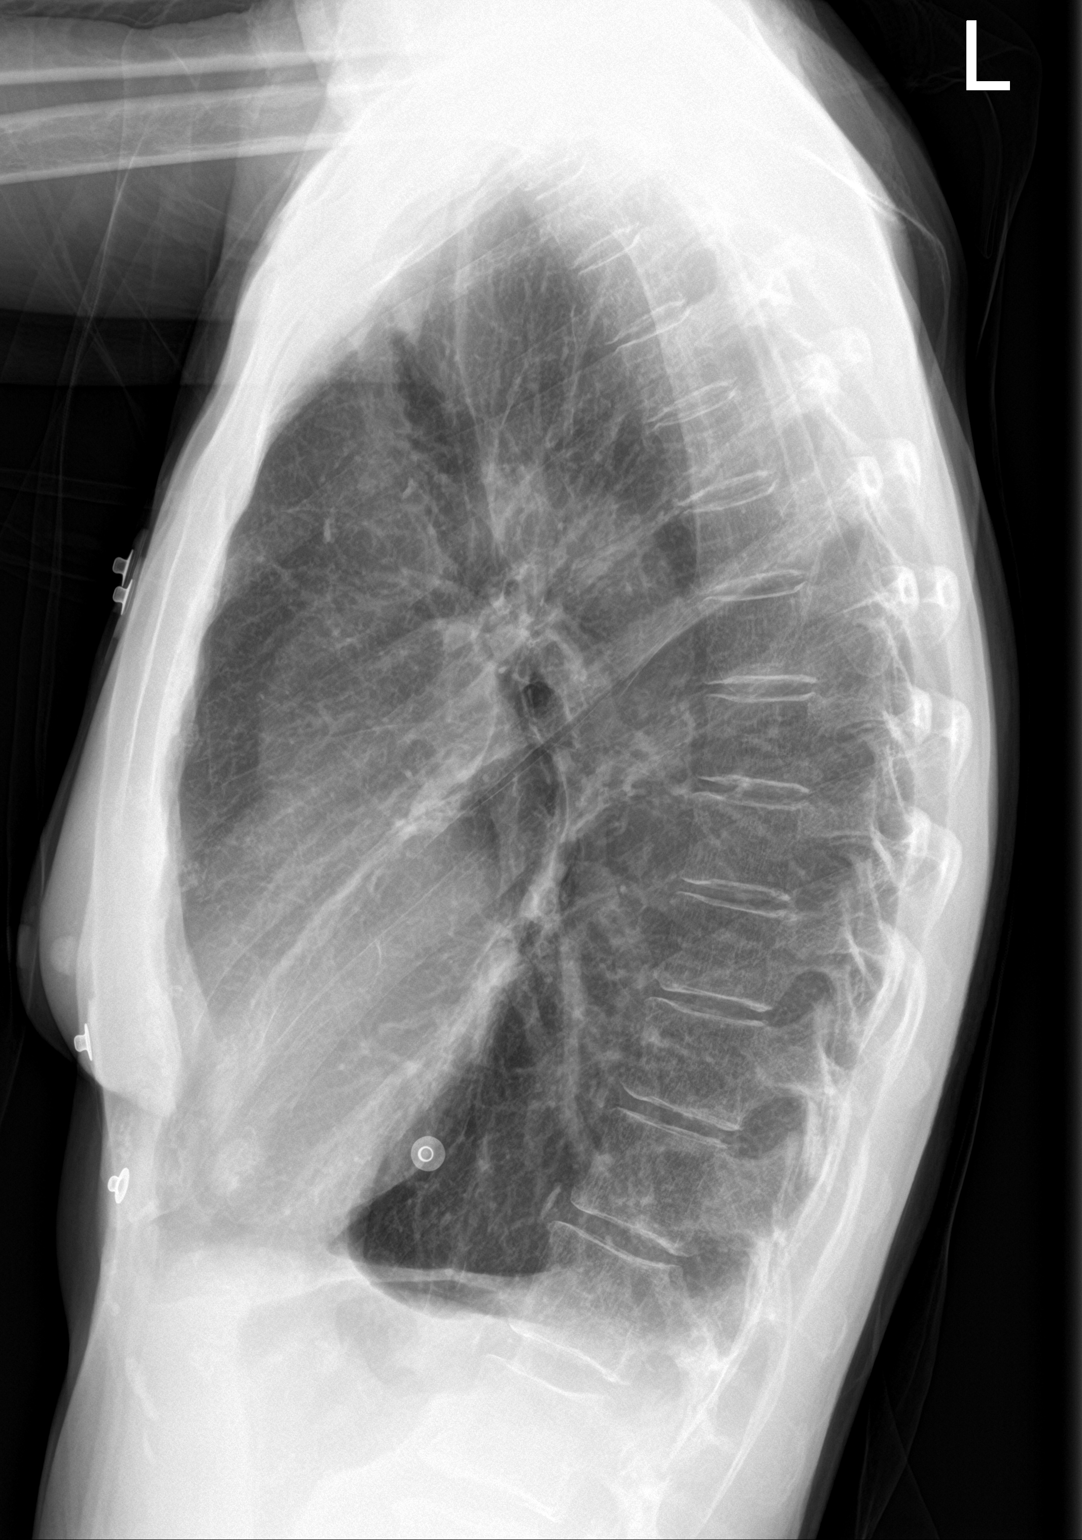

[2 of 2 positions shown; findings below may reference images not displayed]

FINDINGS: Frontal and lateral views of the chest demonstrate an unremarkable
cardiac silhouette. Lungs are hyperinflated with background
interstitial prominence consistent with emphysema. No airspace
disease, effusion, or pneumothorax. There are no acute bony
abnormalities.
IMPRESSION: 1. Emphysema, no acute airspace disease.

## 2022-09-25 ENCOUNTER — Other Ambulatory Visit: Payer: Self-pay | Admitting: Cardiology

## 2022-10-08 ENCOUNTER — Other Ambulatory Visit: Payer: Self-pay

## 2022-10-08 NOTE — Telephone Encounter (Signed)
Pt's pharmacy is requesting a refill on trelegy ellipta inhaler. Would Dr. Jens Som like to refill this non cardiac medication? Please address

## 2022-10-24 ENCOUNTER — Other Ambulatory Visit: Payer: Self-pay | Admitting: Cardiology

## 2022-10-24 NOTE — Telephone Encounter (Signed)
Will need to get from PCP or pulmonary.

## 2022-10-24 NOTE — Telephone Encounter (Signed)
Pt's pharmacy is requesting a refill on Trelegy Ellipta inhaler. This is not a cardiac medication. Would Dr. Jens Som like to refill this medication? Please address

## 2022-10-30 ENCOUNTER — Other Ambulatory Visit: Payer: Self-pay

## 2022-10-31 ENCOUNTER — Other Ambulatory Visit: Payer: Self-pay

## 2022-11-06 ENCOUNTER — Other Ambulatory Visit: Payer: Self-pay

## 2022-11-06 MED ORDER — CARVEDILOL 6.25 MG PO TABS
ORAL_TABLET | ORAL | 1 refills | Status: DC
Start: 2022-11-06 — End: 2023-05-02

## 2022-11-20 ENCOUNTER — Other Ambulatory Visit: Payer: Self-pay | Admitting: Cardiology

## 2022-11-21 ENCOUNTER — Other Ambulatory Visit: Payer: Self-pay | Admitting: Cardiology

## 2022-11-21 NOTE — Telephone Encounter (Signed)
This needs to come from medical doctor

## 2022-11-21 NOTE — Telephone Encounter (Signed)
Call pt to inform her per Deliah Goody, RN, that pt needed to contact PCP for any refills for her medication Trelegy Ellipta inhaler. Pt verbalized understanding.

## 2022-11-21 NOTE — Telephone Encounter (Signed)
*  STAT* If patient is at the pharmacy, call can be transferred to refill team.   1. Which medications need to be refilled? (please list name of each medication and dose if known) TRELEGY ELLIPTA 100-62.5-25 MCG/ACT AEPB  2. Which pharmacy/location (including street and city if local pharmacy) is medication to be sent to? [id number] - [place], [place] - [address]  3. Do they need a 30 day or 90 day supply?  90 day supply

## 2023-01-17 ENCOUNTER — Other Ambulatory Visit: Payer: Self-pay | Admitting: Cardiology

## 2023-01-17 DIAGNOSIS — I4819 Other persistent atrial fibrillation: Secondary | ICD-10-CM

## 2023-01-17 NOTE — Telephone Encounter (Signed)
Eliquis 5mg  refill request received. Patient is 72 years old, weight-55.4kg, Crea-0.63 on 12/19/22 via [employer] [facility],  Diagnosis-Afib, and last seen by Dr. Jens Som on 05/02/22. Dose is appropriate based on dosing criteria. Will send in refill to requested pharmacy.

## 2023-04-08 ENCOUNTER — Encounter: Payer: Self-pay | Admitting: Neurology

## 2023-04-08 ENCOUNTER — Ambulatory Visit: Payer: Medicare HMO | Admitting: Neurology

## 2023-04-08 VITALS — BP 149/64 | HR 58 | Ht 66.0 in | Wt 128.0 lb

## 2023-04-08 DIAGNOSIS — R413 Other amnesia: Secondary | ICD-10-CM

## 2023-04-08 DIAGNOSIS — R4189 Other symptoms and signs involving cognitive functions and awareness: Secondary | ICD-10-CM | POA: Diagnosis not present

## 2023-04-08 DIAGNOSIS — Z820 Family history of epilepsy and other diseases of the nervous system: Secondary | ICD-10-CM | POA: Diagnosis not present

## 2023-04-08 DIAGNOSIS — G309 Alzheimer's disease, unspecified: Secondary | ICD-10-CM

## 2023-04-08 DIAGNOSIS — D510 Vitamin B12 deficiency anemia due to intrinsic factor deficiency: Secondary | ICD-10-CM

## 2023-04-08 DIAGNOSIS — Z8679 Personal history of other diseases of the circulatory system: Secondary | ICD-10-CM

## 2023-04-08 MED ORDER — ALPRAZOLAM 0.5 MG PO TABS: 0.5000 mg | ORAL_TABLET | Freq: Every evening | ORAL | 0 refills | Status: DC | PRN

## 2023-04-08 NOTE — Progress Notes (Signed)
 SLEEP MEDICINE CLINIC    Provider:  Dedra Gores, MD  Primary Care Physician:  Pura Lenis, MD [address] [place] [address]     Referring Provider: Pura Lenis, Md [address] [place],  [place] [address]          Chief Complaint according to patient   Patient presents with:     New Patient (Initial Visit)           HISTORY OF PRESENT ILLNESS:  Anita Donovan is a 73 y.o. female patient who is seen upon PCP referral on 04/08/2023  for a memory evaluation and gave additional input about atrial fib, currently in rhythm post ablation.  Th patient reports a strong family history for dementia: 2 of 4 sisters, both parents. Grandparents were not affected but had heart disease.   Chief concern according to patient :     I have the pleasure of seeing Anita Donovan 04/08/23 a right -handed female with a possible memory loss disorder , also needing OSA rule out- sleep disorder.    The patient reports that she is self conscious about memory loss, and has noted delayed recall for some nouns, names. She gets stuck in mid-sentence.    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  Tobacco use: until 2019.   ETOH use : quit : 2022, atrial fib  , Caffeine intake in form of Coffee( /) Soda( /) Tea ( /) or energy drinks Exercise in form of [employer] classes. Cardio  and core strength.    Independent , driving, keeping her house,  Finances are under her guidance.    Sleep habits are as follows: The patient's dinner time is between 7.30 PM. The patient goes to bed at 11 PM after watching Tv in the den- and continues to sleep for 6-8 hours, her dogs will wake her up- The preferred sleep position is laterally- , with the support of 1-2 pillows. Dreams are reportedly  frequent/vivid- no enactment. .   The patient wakes up spontaneously/ &-8  is the usual rise time.   Review of Systems: Out of a complete 14 system review, the patient complains of only the following symptoms, and all other reviewed systems are negative.:  Atrial fib in the past.   COPD    How likely are you to doze in the following situations: 0 = not likely, 1 = slight chance, 2 = moderate chance, 3 = high chance   Sitting and Reading? Watching Television? Sitting inactive in a public place (theater or meeting)? As a passenger in a car for an hour without a break? Lying down in the afternoon when circumstances permit? Sitting and talking to someone? Sitting quietly after lunch without alcohol? In a car, while stopped for a few minutes in traffic?   Total = 2/ 24 points   FSS endorsed at 9/ 63 points.   Social History   Socioeconomic History   Marital status: Widowed    Spouse name: Not on file   Number of children: Not on file   Years of education: Not on file   Highest education level: Not on file  Occupational History   Not on file  Tobacco Use   Smoking status: Former    Current  packs/day: 0.00    Types: Cigarettes    Quit date: 04/08/2017    Years since quitting: 6.0   Smokeless tobacco: Never  Vaping Use   Vaping status: Never Used  Substance and Sexual Activity   Alcohol use: Not Currently    Comment: 2 glasses per night   Drug use: Never   Sexual activity: Not on file  Other Topics Concern   Not on file  Social History Narrative   Lives alone    Retired    Social Drivers of Corporate Investment Banker Strain: Low Risk  (12/16/2022)   Received from [facility]   Overall Financial Resource Strain (CARDIA)    Difficulty of Paying Living Expenses: Not hard at all  Food Insecurity: No Food Insecurity (12/16/2022)   Received from [facility]   Hunger Vital Sign    Worried About Running Out of Food in the Last Year: Never true    Ran Out of Food in  the Last Year: Never true  Transportation Needs: No Transportation Needs (12/16/2022)   Received from [facility] - Transportation    Lack of Transportation (Medical): No    Lack of Transportation (Non-Medical): No  Physical Activity: Sufficiently Active (12/16/2022)   Received from [facility]   Exercise Vital Sign    Days of Exercise per Week: 5 days    Minutes of Exercise per Session: 30 min  Stress: No Stress Concern Present (12/16/2022)   Received from [facility] - Occupational Stress Questionnaire    Feeling of Stress : Not at all  Social Connections: Moderately Integrated (12/16/2022)   Received from [facility]   Social Network    How would you rate your social network (family, work, friends)?: Adequate participation with social networks    Family History  Problem Relation Age of Onset   Heart disease Mother        Stents   Alzheimer's disease Mother    Alzheimer's disease Father    Dementia Sister    Alzheimer's disease Sister    Colon cancer Neg Hx    Colon polyps Neg Hx    Esophageal cancer Neg Hx    Rectal cancer Neg Hx    Stomach cancer Neg Hx     Past Medical History:  Diagnosis Date   Allergy    Arthritis    knee right   Cancer ([facility])    basal cell carcimona   Complication of anesthesia    slow to wake up suring colonoscopy   Multiple thyroid  nodules    Myocardial infarction [facility])    Osteoporosis    Persistent atrial fibrillation ([facility]) 12/20/2020   SVT (supraventricular tachycardia) ([facility])     Past Surgical History:  Procedure Laterality Date   CARDIOVERSION N/A 12/20/2020   Procedure: CARDIOVERSION;  Surgeon: Raford Riggs, MD;  Location: [facility] ENDOSCOPY;  Service: Cardiovascular;  Laterality: N/A;   COLONOSCOPY     IR RADIOLOGIST EVAL & MGMT  09/21/2021   LAPAROSCOPIC APPENDECTOMY N/A 10/12/2021   Procedure: LAPAROSCOPIC APPENDECTOMY;  Surgeon: Curvin Deward MOULD, MD;  Location: [facility];   Service: General;  Laterality: N/A;   LEFT HEART CATH AND CORONARY ANGIOGRAPHY N/A 04/05/2020   Procedure: LEFT HEART CATH AND CORONARY ANGIOGRAPHY;  Surgeon: Claudene Victory ORN, MD;  Location: [facility];  Service: Cardiovascular;  Laterality: N/A;   MOHS SURGERY     POLYPECTOMY     TONSILLECTOMY  Current Outpatient Medications on File Prior to Visit  Medication Sig Dispense Refill   acetaminophen  (TYLENOL ) 325 MG tablet Take 325 mg by mouth every 6 (six) hours as needed for moderate pain.     alendronate  (FOSAMAX ) 70 MG tablet Take 70 mg by mouth every Monday.  3   b complex vitamins capsule Take 1 capsule by mouth daily.     Calcium Carbonate-Vitamin D (CALTRATE 600+D PO) Take 1 capsule by mouth daily.     carvedilol  (COREG ) 6.25 MG tablet TAKE 1 TABLET(6.25 MG) BY MOUTH TWICE DAILY 180 tablet 1   diclofenac Sodium (VOLTAREN) 1 % GEL Apply 1 Application topically 4 (four) times daily as needed (pain).     ELIQUIS  5 MG TABS tablet TAKE 1 TABLET(5 MG) BY MOUTH TWICE DAILY 60 tablet 5   fluticasone  (FLONASE) 50 MCG/ACT nasal spray Place 1 spray into both nostrils daily as needed for allergies or rhinitis.     levothyroxine  (SYNTHROID ) 50 MCG tablet Take 50 mcg by mouth daily before breakfast.     metoprolol  succinate (TOPROL -XL) 25 MG 24 hr tablet Take 25 mg by mouth daily as needed (rapid heart rate).     Multiple Vitamins-Minerals (CENTRUM SILVER ADULT 50+ PO) Take 1 tablet by mouth daily.     TRELEGY ELLIPTA  100-62.5-25 MCG/ACT AEPB INHALE 1 PUFF INTO THE LUNGS DAILY 60 each 0   nitroGLYCERIN  (NITROSTAT ) 0.4 MG SL tablet Place 1 tablet (0.4 mg total) under the tongue every 5 (five) minutes as needed for chest pain. 25 tablet 3   No current facility-administered medications on file prior to visit.    No Known Allergies   DIAGNOSTIC DATA (LABS, IMAGING, TESTING) - I reviewed patient records, labs, notes, testing and imaging myself where available.  Lab Results  Component  Value Date   WBC 4.8 10/05/2021   HGB 12.0 10/05/2021   HCT 36.9 10/05/2021   MCV 96.3 10/05/2021   PLT 241 10/05/2021      Component Value Date/Time   NA 138 10/05/2021 1314   NA 135 12/12/2020 1130   K 4.2 10/05/2021 1314   CL 106 10/05/2021 1314   CO2 26 10/05/2021 1314   GLUCOSE 100 (H) 10/05/2021 1314   BUN 20 10/05/2021 1314   BUN 17 12/12/2020 1130   CREATININE 0.62 10/05/2021 1314   CREATININE 0.79 02/12/2014 1521   CALCIUM 9.3 10/05/2021 1314   PROT 7.7 08/05/2021 1140   ALBUMIN 4.1 08/05/2021 1140   AST 24 08/05/2021 1140   ALT 22 08/05/2021 1140   ALKPHOS 58 08/05/2021 1140   BILITOT 1.9 (H) 08/05/2021 1140   GFRNONAA >60 10/05/2021 1314   GFRNONAA 79 02/12/2014 1521   GFRAA >89 02/12/2014 1521   Lab Results  Component Value Date   CHOL 161 04/05/2020   HDL 66 04/05/2020   LDLCALC 85 04/05/2020   TRIG 51 04/05/2020   CHOLHDL 2.4 04/05/2020   Lab Results  Component Value Date   HGBA1C 5.1 04/05/2020   No results found for: VITAMINB12 Lab Results  Component Value Date   TSH 6.180 (H) 05/02/2020   mo ago   WBC 3.7 - 11.0 thou/mcL 4.4  RBC 4.01 - 4.90 million/mcL 3.54 Low   HGB 12.2 - 14.9 gm/dL 86.9  HCT 64.1 - 52.0 % 32.5 Low   MCV 82.0 - 98.0 fL 91.6  MCH 27.0 - 33.0 pg 36.8 High   MCHC 31.0 - 37.0 gm/dL 59.7 High   Plt Ct 849 - 400 thou/mcL 215  RDW CV 11.8 - 14.9 % 12.7  NEUTROPHIL % % 68.2  LYMPHOCYTE % % 25.0  MID% % 6.8  ABSOLUTE NEUTROPHIL COUNT 1.50 - 7.50 thou/mcL 3.00  ABSOLUTE LYMPHOCYTE COUNT 1.00 - 4.50 thou/mcL 1.10  ABSOLUTE MID 0.1 - 1.5 thou/mcL <0.5   PHYSICAL EXAM:  Today's Vitals   04/08/23 1329  BP: (!) 149/64  Pulse: (!) 58  Weight: 128 lb (58.1 kg)  Height: 5' 6 (1.676 m)   Body mass index is 20.66 kg/m.   Wt Readings from Last 3 Encounters:  04/08/23 128 lb (58.1 kg)  05/02/22 122 lb 3.2 oz (55.4 kg)  11/10/21 116 lb 3.2 oz (52.7 kg)     Ht Readings from Last 3 Encounters:  04/08/23  5' 6 (1.676 m)  05/02/22 5' 6 (1.676 m)  11/10/21 5' 7 (1.702 m)      General: The patient is awake, alert and appears not in acute distress. The patient is well groomed. Head: Normocephalic, atraumatic. Neck is supple.  Mallampati: 1  neck circumference:13 inches .  Nasal airflow is patent.  Dental status: biological  Cardiovascular:  Regular rate and cardiac rhythm by pulse,  without distended neck veins. Respiratory: Lungs are clear to auscultation.  Skin:  Without evidence of ankle edema, or rash. Trunk: The patient's posture is erect.   NEUROLOGIC EXAM: The patient is awake and alert, oriented to place and time.   Memory subjective described as impaired-      04/08/2023    1:32 PM  Montreal Cognitive Assessment   Visuospatial/ Executive (0/5) 3  Naming (0/3) 3  Attention: Read list of digits (0/2) 1  Attention: Read list of letters (0/1) 1  Attention: Serial 7 subtraction starting at 100 (0/3) 2  Language: Repeat phrase (0/2) 1  Language : Fluency (0/1) 1  Abstraction (0/2) 2  Delayed Recall (0/5) 0  Orientation (0/6) 6  Total 20   mini-mental status exam 27/ 30  Attention span & concentration ability appears normal.   Speech is fluent,  without  dysarthria, dysphonia or aphasia.  Mood and affect are appropriate.   Cranial nerves: no loss of smell or taste reported  Pupils are equal and briskly reactive to light.  .  Extraocular movements in vertical and horizontal planes were intact and without nystagmus. Had cataract surgery,  No Diplopia. Visual fields by finger perimetry are intact. Smooth.  Hearing was intact to soft voice and finger rubbing.    Facial sensation intact to fine touch.  Facial motor strength is symmetric and tongue and uvula move midline.  Neck ROM : rotation, tilt and flexion extension were normal for age and shoulder shrug was symmetrical.    Motor exam:  Symmetric bulk, tone and ROM.   Normal tone without cog -wheeling, asymmetric grip  strength . Loss of strength on the right more than left, thenar atrophy, arthritic changes.    Sensory:  Fine touch and vibration were  normal.  Proprioception tested in the upper extremities was normal.   Coordination: Rapid alternating movements in the fingers/hands were of normal speed.  The Finger-to-nose maneuver was intact without evidence of ataxia, dysmetria and only mild  tremor.   Gait and station: Patient could rise unassisted from a seated position, walked without assistive device.  Stance is of normal width/ base and the patient turned with 3 steps.  Toe and heel walk were deferred.  Deep tendon reflexes: in the  upper and lower extremities are symmetric and intact.  Babinski  response was deferred .    ASSESSMENT AND PLAN 73 y.o. year old female  here with:    04/08/2023    1:32 PM  Montreal Cognitive Assessment   Visuospatial/ Executive (0/5) 3  Naming (0/3) 3  Attention: Read list of digits (0/2) 1  Attention: Read list of letters (0/1) 1  Attention: Serial 7 subtraction starting at 100 (0/3) 2  Language: Repeat phrase (0/2) 1  Language : Fluency (0/1) 1  Abstraction (0/2) 2  Delayed Recall (0/5) 0  Orientation (0/6) 6  Total 20       1) short term memory loss in the setting of a strong family history for Alzheimer type dementia, ATN bio -markers were positive. Normal other labs,  CMET, TSH> had elevated bilirubin once. Has macrocytic anemia. Check B 12 level.   2) MOCA was 20/ 30 points.   3) no MRI of the brain was yet obtained> need to rule out micro-hemorrhages, vascular disease.  If no hemorrhages seen, check on PET amyloid scan and refer for Leqembi or similar.    I will also refer to neuropsychology.  4) check for sublingual B 12 supplement.   5) HST suggested. COPD,   I plan to follow up either personally or through our NP within 5-6  months.   I would like to thank Pura Lenis, Md [address] [place],  [place] [address] for  allowing me to meet with and to take care of this pleasant patient.    After spending a total time of  45  minutes face to face and additional time for physical and neurologic examination, review of laboratory studies,  personal review of imaging studies, reports and results of other testing and review of referral information / records as far as provided in visit,   Electronically signed by: Dedra Gores, MD 04/08/2023 1:56 PM  [facility] Board certified by The [employer] of Sleep Medicine and Diplomate of the [employer] of Sleep Medicine. Board certified In Neurology through the ABPN, Fellow of the [employer] of Neu[employer].

## 2023-04-08 NOTE — Patient Instructions (Addendum)
 Amyloid Beta Protein Precursor Test Why am I having this test? The amyloid beta protein precursor test can be used to help diagnose Alzheimer's disease (AD) or certain other types of dementia. A health care provider may order this test if you or someone you care for has symptoms of dementia, which include: Memory loss. Behavioral changes. Decreased ability to perform daily functions. This test may be used along with a thorough exam and other methods to help determine the cause of the symptoms. The other methods may include cognitive tests to assess memory or scanning tests of the brain to look for problems. What is being tested? This test measures the levels of the beta amyloid protein in the fluid surrounding the brain and spinal cord (cerebrospinal fluid, or CSF). Free beta amyloid proteins in the CSF have been shown to protect the brain. Levels of beta amyloid protein are decreased in the CSF of people with AD. What kind of sample is taken?  Samples of cerebrospinal fluid are required for this test. The samples are collected using a procedure in which a small needle is inserted into your lower back while you lie on your side. The needle is used to draw fluid from an area around the spinal cord. This procedure is called a lumbar puncture, or a spinal tap. The procedure is usually done in a hospital or clinic. The tests are run on the fluid obtained from the lumbar puncture. How are the results reported? Your test results will be reported as values. Your health care provider will compare your results to normal ranges that were established after testing a large group of people (reference values). Reference values may vary among labs and hospitals. For this test, a common reference value of beta amyloid protein is: Greater than 450 units/L. What do the results mean? Decreased levels of beta amyloid proteins in the CSF may indicate: Alzheimer's disease. Other forms of dementia. Talk with your  health care provider about what your results mean. Questions to ask your health care provider Ask your health care provider, or the department that is doing the test: When will my results be ready? How will I get my results? What are my treatment options? What other tests do I need? What are my next steps? Summary This test can be used to help diagnose Alzheimer's disease (AD) and certain other types of dementia. Cerebrospinal fluid (CSF) samples are collected using a lumbar puncture procedure, and the sample is tested to measure the level of beta amyloid protein. Levels of beta amyloid protein are decreased in the CSF of people with AD. This information is not intended to replace advice given to you by your health care provider. Make sure you discuss any questions you have with your health care provider. Document Revised: 08/03/2019 Document Reviewed: 08/03/2019 Elsevier Patient Education  2024 Elsevier Inc. Memory Compensation Strategies  Use WARM strategy.  W= write it down  A= associate it  R= repeat it  M= make a mental note  2.   You can keep a Glass Blower/designer.  Use a 3-ring notebook with sections for the following: calendar, important names and phone numbers,  medications, doctors' names/phone numbers, lists/reminders, and a section to journal what you did  each day.   3.    Use a calendar to write appointments down.  4.    Write yourself a schedule for the day.  This can be placed on the calendar or in a separate section of the Memory Notebook.  Keeping a  regular schedule can help memory.  5.    Use medication organizer with sections for each day or morning/evening pills.  You may need help loading it  6.    Keep a basket, or pegboard by the door.  Place items that you need to take out with you in the basket or on the pegboard.  You may also want to  include a message board for reminders.  7.    Use sticky notes.  Place sticky notes with reminders in a place where  the task is performed.  For example:  turn off the  stove placed by the stove, lock the door placed on the door at eye level,  take your medications on  the bathroom mirror or by the place where you normally take your medications.  8.    Use alarms/timers.  Use while cooking to remind yourself to check on food or as a reminder to take your medicine, or as a  reminder to make a call, or as a reminder to perform another task, etc. Management of Memory Problems  There are some general things you can do to help manage your memory problems.  Your memory may not in fact recover, but by using techniques and strategies you will be able to manage your memory difficulties better.  1)  Establish a routine. Try to establish and then stick to a regular routine.  By doing this, you will get used to what to expect and you will reduce the need to rely on your memory.  Also, try to do things at the same time of day, such as taking your medication or checking your calendar first thing in the morning. Think about think that you can do as a part of a regular routine and make a list.  Then enter them into a daily planner to remind you.  This will help you establish a routine.  2)  Organize your environment. Organize your environment so that it is uncluttered.  Decrease visual stimulation.  Place everyday items such as keys or cell phone in the same place every day (ie.  Basket next to front door) Use post it notes with a brief message to yourself (ie. Turn off light, lock the door) Use labels to indicate where things go (ie. Which cupboards are for food, dishes, etc.) Keep a notepad and pen by the telephone to take messages  3)  Memory Aids A diary or journal/notebook/daily planner Making a list (shopping list, chore list, to do list that needs to be done) Using an alarm as a reminder (kitchen timer or cell phone alarm) Using cell phone to store information (Notes, Calendar, Reminders) Calendar/White board  placed in a prominent position Post-it notes  In order for memory aids to be useful, you need to have good habits.  It's no good remembering to make a note in your journal if you don't remember to look in it.  Try setting aside a certain time of day to look in journal.  4)  Improving mood and managing fatigue. There may be other factors that contribute to memory difficulties.  Factors, such as anxiety, depression and tiredness can affect memory. Regular gentle exercise can help improve your mood and give you more energy. Simple relaxation techniques may help relieve symptoms of anxiety Try to get back to completing activities or hobbies you enjoyed doing in the past. Learn to pace yourself through activities to decrease fatigue. Find out about some local support groups where you can share  experiences with others. Try and achieve 7-8 hours of sleep at night.    1) short term memory loss in the setting of a strong family history for Alzheimer type dementia, ATN bio -markers were positive. Normal other labs,  CMET, TSH> had elevated bilirubin once. Has macrocytic anemia. Check B 12 level.    2) MOCA was 20/ 30 points.    3) no MRI of the brain was yet obtained> need to rule out micro-hemorrhages, vascular disease.  If no hemorrhages seen, check on PET amyloid scan and refer for Leqembi or similar.    I will also refer to neuropsychology.   4) check for sublingual B 12 supplement.    5) HST suggested. COPD,    I plan to follow up either personally or through our NP within 5-6  months.    I would like to thank Pura Lenis, Md [address] [place],  [place] [address] for allowing me to meet with and to take care of this pleasant patient.      After spending a total time of  45  minutes face to face and additional time for physical and neurologic examination, review of laboratory studies,  personal review of imaging studies, reports and results of other testing and  review of referral information / records as far as provided in visit,    Electronically signed by: Dedra Gores, MD 04/08/2023 1:56 PM

## 2023-04-08 NOTE — Addendum Note (Signed)
 Addended by: Melvyn Novas on: 04/08/2023 02:42 PM   Modules accepted: Orders

## 2023-04-09 ENCOUNTER — Telehealth: Payer: Self-pay | Admitting: Neurology

## 2023-04-09 NOTE — Telephone Encounter (Signed)
 Francine Graven Berkley Harvey: [id number] exp. 04/09/23-06/08/23 sent to [facility] [phone]

## 2023-04-11 ENCOUNTER — Other Ambulatory Visit: Payer: Self-pay | Admitting: Neurology

## 2023-04-11 ENCOUNTER — Telehealth: Payer: Self-pay | Admitting: Neurology

## 2023-04-11 DIAGNOSIS — G309 Alzheimer's disease, unspecified: Secondary | ICD-10-CM

## 2023-04-11 DIAGNOSIS — Z820 Family history of epilepsy and other diseases of the nervous system: Secondary | ICD-10-CM

## 2023-04-11 DIAGNOSIS — Z8679 Personal history of other diseases of the circulatory system: Secondary | ICD-10-CM

## 2023-04-11 DIAGNOSIS — R4189 Other symptoms and signs involving cognitive functions and awareness: Secondary | ICD-10-CM

## 2023-04-11 DIAGNOSIS — R413 Other amnesia: Secondary | ICD-10-CM

## 2023-04-11 NOTE — Telephone Encounter (Signed)
 Referral for neuropsychology fax to [facility]. [phone], Fax: [phone]

## 2023-04-15 LAB — IRON,TIBC AND FERRITIN PANEL
Ferritin: 111 ng/mL (ref 15–150)
Iron Saturation: 15 % (ref 15–55)
Iron: 54 ug/dL (ref 27–139)
Total Iron Binding Capacity: 361 ug/dL (ref 250–450)
UIBC: 307 ug/dL (ref 118–369)

## 2023-04-15 LAB — APOE ALZHEIMER'S RISK

## 2023-04-15 LAB — VITAMIN B12: Vitamin B-12: 776 pg/mL (ref 232–1245)

## 2023-04-22 ENCOUNTER — Encounter: Payer: Self-pay | Admitting: Neurology

## 2023-04-30 NOTE — Progress Notes (Signed)
HPI: FU atrial fibrillation. The patient was seen by Dr. Alanda Amass in June 2011 following an episode of palpitations requiring evaluation in the emergency room. There were apparently no rhythm strips available and he felt as though she may be having SVT. Abdominal ultrasound March 2016 showed no aneurysm. Admitted January 2022 with mildly elevated troponin and chest pain.  Echocardiogram January 2022 showed normal LV function.  Cardiac catheterization January 2022 showed vessel tortuosity but no obstructive coronary disease.  Symptoms felt possibly secondary to vasospasm.  Previously seen in the office with palpitations and found to be in atrial fibrillation.  Patient had successful cardioversion on December 20, 2020.  Since last seen,  the patient denies any dyspnea on exertion, orthopnea, PND, pedal edema, palpitations, syncope or chest pain.   Current Outpatient Medications  Medication Sig Dispense Refill   acetaminophen (TYLENOL) 325 MG tablet Take 325 mg by mouth every 6 (six) hours as needed for moderate pain.     alendronate (FOSAMAX) 70 MG tablet Take 70 mg by mouth every Monday.  3   b complex vitamins capsule Take 1 capsule by mouth daily.     Calcium Carbonate-Vitamin D (CALTRATE 600+D PO) Take 1 capsule by mouth daily.     carvedilol (COREG) 6.25 MG tablet TAKE 1 TABLET(6.25 MG) BY MOUTH TWICE DAILY 180 tablet 0   diclofenac Sodium (VOLTAREN) 1 % GEL Apply 1 Application topically 4 (four) times daily as needed (pain).     ELIQUIS 5 MG TABS tablet TAKE 1 TABLET(5 MG) BY MOUTH TWICE DAILY 60 tablet 5   fluticasone (FLONASE) 50 MCG/ACT nasal spray Place 1 spray into both nostrils daily as needed for allergies or rhinitis.     levothyroxine (SYNTHROID) 50 MCG tablet Take 50 mcg by mouth daily before breakfast.     Multiple Vitamins-Minerals (CENTRUM SILVER ADULT 50+ PO) Take 1 tablet by mouth daily.     prednisoLONE acetate (PRED FORTE) 1 % ophthalmic suspension Place 1 drop into the  left eye 4 (four) times daily.     TRELEGY ELLIPTA 100-62.5-25 MCG/ACT AEPB INHALE 1 PUFF INTO THE LUNGS DAILY 60 each 0   ALPRAZolam (XANAX) 0.5 MG tablet Take 1 tablet (0.5 mg total) by mouth at bedtime as needed for anxiety (MRI premedication). (Patient not taking: Reported on 05/14/2023) 2 tablet 0   metoprolol succinate (TOPROL-XL) 25 MG 24 hr tablet Take 25 mg by mouth daily as needed (rapid heart rate). (Patient not taking: Reported on 05/14/2023)     nitroGLYCERIN (NITROSTAT) 0.4 MG SL tablet Place 1 tablet (0.4 mg total) under the tongue every 5 (five) minutes as needed for chest pain. (Patient not taking: Reported on 05/14/2023) 25 tablet 3   ofloxacin (OCUFLOX) 0.3 % ophthalmic solution Place 1 drop into the left eye 4 (four) times daily. (Patient not taking: Reported on 05/14/2023)     No current facility-administered medications for this visit.     Past Medical History:  Diagnosis Date   Allergy    Arthritis    knee right   Cancer (HCC)    basal cell carcimona   Complication of anesthesia    slow to wake up suring colonoscopy   Multiple thyroid nodules    Myocardial infarction [facility])    Osteoporosis    Persistent atrial fibrillation (HCC) 12/20/2020   SVT (supraventricular tachycardia) (HCC)     Past Surgical History:  Procedure Laterality Date   CARDIOVERSION N/A 12/20/2020   Procedure: CARDIOVERSION;  Surgeon: Chilton Si,  MD;  Location: [facility] ENDOSCOPY;  Service: Cardiovascular;  Laterality: N/A;   COLONOSCOPY     IR RADIOLOGIST EVAL & MGMT  09/21/2021   LAPAROSCOPIC APPENDECTOMY N/A 10/12/2021   Procedure: LAPAROSCOPIC APPENDECTOMY;  Surgeon: Griselda Miner, MD;  Location: [facility] ORS;  Service: General;  Laterality: N/A;   LEFT HEART CATH AND CORONARY ANGIOGRAPHY N/A 04/05/2020   Procedure: LEFT HEART CATH AND CORONARY ANGIOGRAPHY;  Surgeon: Lyn Records, MD;  Location: [facility] INVASIVE CV LAB;  Service: Cardiovascular;  Laterality: N/A;   MOHS SURGERY     POLYPECTOMY      TONSILLECTOMY      Social History   Socioeconomic History   Marital status: Widowed    Spouse name: Not on file   Number of children: Not on file   Years of education: Not on file   Highest education level: Not on file  Occupational History   Not on file  Tobacco Use   Smoking status: Former    Current packs/day: 0.00    Types: Cigarettes    Quit date: 04/08/2017    Years since quitting: 6.1   Smokeless tobacco: Never  Vaping Use   Vaping status: Never Used  Substance and Sexual Activity   Alcohol use: Not Currently    Comment: 2 glasses per night   Drug use: Never   Sexual activity: Not on file  Other Topics Concern   Not on file  Social History Narrative   Lives alone    Retired    Social Drivers of Corporate investment banker Strain: Low Risk  (12/16/2022)   Received from [facility]   Overall Financial Resource Strain (CARDIA)    Difficulty of Paying Living Expenses: Not hard at all  Food Insecurity: No Food Insecurity (12/16/2022)   Received from [facility]   Hunger Vital Sign    Worried About Running Out of Food in the Last Year: Never true    Ran Out of Food in the Last Year: Never true  Transportation Needs: No Transportation Needs (12/16/2022)   Received from [facility] - Transportation    Lack of Transportation (Medical): No    Lack of Transportation (Non-Medical): No  Physical Activity: Sufficiently Active (12/16/2022)   Received from [facility]   Exercise Vital Sign    Days of Exercise per Week: 5 days    Minutes of Exercise per Session: 30 min  Stress: No Stress Concern Present (12/16/2022)   Received from [facility] - Occupational Stress Questionnaire    Feeling of Stress : Not at all  Social Connections: Moderately Integrated (12/16/2022)   Received from [facility]   Social Network    How would you rate your social network (family, work, friends)?: Adequate participation with  social networks  Intimate Partner Violence: Not At Risk (06/18/2022)   Received from [facility]   Humiliation, Afraid, Rape, and Kick questionnaire    Fear of Current or Ex-Partner: No    Emotionally Abused: No    Physically Abused: No    Sexually Abused: No    Family History  Problem Relation Age of Onset   Heart disease Mother        Stents   Alzheimer's disease Mother    Alzheimer's disease Father    Dementia Sister    Alzheimer's disease Sister    Colon cancer Neg Hx    Colon polyps Neg Hx    Esophageal  cancer Neg Hx    Rectal cancer Neg Hx    Stomach cancer Neg Hx     ROS: no fevers or chills, productive cough, hemoptysis, dysphasia, odynophagia, melena, hematochezia, dysuria, hematuria, rash, seizure activity, orthopnea, PND, pedal edema, claudication. Remaining systems are negative.  Physical Exam: Well-developed well-nourished in no acute distress.  Skin is warm and dry.  HEENT is normal.  Neck is supple.  Chest is clear to auscultation with normal expansion.  Cardiovascular exam is regular rate and rhythm.  Abdominal exam nontender or distended. No masses palpated. Extremities show no edema. neuro grossly intact  EKG Interpretation Date/Time:  Tuesday May 14 2023 08:40:14 EST Ventricular Rate:  47 PR Interval:  156 QRS Duration:  76 QT Interval:  436 QTC Calculation: 385 R Axis:   44  Text Interpretation: Sinus bradycardia Confirmed by Olga Millers ([id number]) on 05/14/2023 8:44:49 AM     A/P  1 paroxysmal atrial fibrillation-patient remains in sinus rhythm.  Will continue carvedilol for rate control if atrial fibrillation recurs.  Note her heart rate is mildly decreased but she is having no symptoms.  Can consider discontinuing carvedilol in the future if necessary though her palpitations may worsen.  Continue apixaban.  Will have most recent hemoglobin and creatinine forwarded to [place] from primary care.  2 history of possible non-ST  elevation myocardial infarction-catheterization at time of event revealed no obstructive coronary disease and event felt possibly secondary to vasospasm.  3 history of palpitations-well-controlled.  Continue beta-blocker.  4 question history of SVT-not documented.  Continue beta-blocker.  She denies palpitations.  5 elevated blood pressure reading-I have asked her to check her blood pressure at home.  Will add medications based on follow-up readings.  Olga Millers, MD

## 2023-05-02 ENCOUNTER — Other Ambulatory Visit: Payer: Self-pay | Admitting: Cardiology

## 2023-05-14 ENCOUNTER — Ambulatory Visit: Payer: Medicare HMO | Attending: Cardiology | Admitting: Cardiology

## 2023-05-14 ENCOUNTER — Encounter: Payer: Self-pay | Admitting: Cardiology

## 2023-05-14 VITALS — BP 152/66 | HR 47 | Ht 66.0 in | Wt 128.8 lb

## 2023-05-14 DIAGNOSIS — R002 Palpitations: Secondary | ICD-10-CM | POA: Diagnosis not present

## 2023-05-14 DIAGNOSIS — I471 Supraventricular tachycardia, unspecified: Secondary | ICD-10-CM

## 2023-05-14 DIAGNOSIS — I4819 Other persistent atrial fibrillation: Secondary | ICD-10-CM

## 2023-05-14 NOTE — Patient Instructions (Signed)

## 2023-05-17 ENCOUNTER — Ambulatory Visit
Admission: RE | Admit: 2023-05-17 | Discharge: 2023-05-17 | Disposition: A | Discharge status: Home / Self Care | Payer: Medicare HMO | Source: Ambulatory Visit | Attending: Neurology | Admitting: Neurology

## 2023-05-17 DIAGNOSIS — Z8679 Personal history of other diseases of the circulatory system: Secondary | ICD-10-CM

## 2023-05-17 DIAGNOSIS — R413 Other amnesia: Secondary | ICD-10-CM

## 2023-05-17 DIAGNOSIS — R4189 Other symptoms and signs involving cognitive functions and awareness: Secondary | ICD-10-CM

## 2023-05-17 DIAGNOSIS — Z820 Family history of epilepsy and other diseases of the nervous system: Secondary | ICD-10-CM

## 2023-05-17 DIAGNOSIS — G309 Alzheimer's disease, unspecified: Secondary | ICD-10-CM

## 2023-05-22 ENCOUNTER — Encounter: Payer: Self-pay | Admitting: Neurology

## 2023-06-04 ENCOUNTER — Ambulatory Visit (INDEPENDENT_AMBULATORY_CARE_PROVIDER_SITE_OTHER): Payer: Medicare HMO | Admitting: Neurology

## 2023-06-04 DIAGNOSIS — D510 Vitamin B12 deficiency anemia due to intrinsic factor deficiency: Secondary | ICD-10-CM

## 2023-06-04 DIAGNOSIS — R413 Other amnesia: Secondary | ICD-10-CM

## 2023-06-04 DIAGNOSIS — G4710 Hypersomnia, unspecified: Secondary | ICD-10-CM

## 2023-06-04 DIAGNOSIS — R4189 Other symptoms and signs involving cognitive functions and awareness: Secondary | ICD-10-CM

## 2023-06-04 DIAGNOSIS — Z8679 Personal history of other diseases of the circulatory system: Secondary | ICD-10-CM

## 2023-06-05 NOTE — Progress Notes (Signed)
 [facility]  Anita Donovan 73 year old female 27-Mar-1951   HOME SLEEP TEST REPORT ( by Watch PAT)   STUDY DATE:  06-04-2023   ORDERING CLINICIAN:  REFERRING CLINICIAN:    CLINICAL INFORMATION/HISTORY: patient with cognitive decline, MOCA 20/ 30, MMSE 27/ 30 .   ATN positive , no excessive sleepiness not very fatigued but having a known co-morbidity of COPD. HST was ordered to make sure she is not hypoxic in sleep.      Epworth sleepiness score: 2/24.   BMI:  20.6 kg/m   Neck Circumference: 13"   FINDINGS:   Sleep Summary:   Total Recording Time (hours, min):     8 hours 30 minutes   Total Sleep Time (hours, min):     6 hours 29 minutes            Percent REM (%):     7.8%                                   Respiratory Indices:   Calculated pAHI (per hour):    Following CMS criteria the overall AHI was 3.2/h                         REM pAHI:     20/h                                            NREM pAHI:  1.8/h  This patient slept 335 minutes in nonsupine sleep associated with an AHI of 1.3 and only 54 minutes on her back in supine sleep with the AHI reached 14.9/h.  This is a very supine dependent sleep apnea.  Snoring reached a mean volume of 41 dB and was present for about 20% of total recorded sleep time. Oxygen Saturation Statistics:   Oxygen Saturation (%) Mean:     93%          O2 Saturation Range (%):   Between the nadir at 88% and a maximum saturation of 97%.                                    O2 Saturation (minutes) <89%:     Less than 1 minute      Pulse Rate Statistics:   Pulse Mean (bpm):    52 bpm             Pulse Range:        From 43 bpm to a maximum of 75 bpm.         IMPRESSION:  This HST confirms that sleep apnea is not present.  There was no significant hypoxemia noted.  There was intermittent bradycardia seen which can be medication related.     RECOMMENDATION: Follow-up for initiating treatment of a sleep disorder is  not needed.  This patient does not have obstructive sleep apnea nor hypoxia.  Central apneas were also not seen. I would recommend to avoid sleep supine sleep position and general as this seems to have exacerbated snoring.    INTERPRETING PHYSICIAN:   Melvyn Novas, MD

## 2023-06-13 NOTE — Procedures (Signed)
 [facility]  Anita Donovan 73 year old female 27-Mar-1951   HOME SLEEP TEST REPORT ( by Watch PAT)   STUDY DATE:  06-04-2023   ORDERING CLINICIAN:  REFERRING CLINICIAN:    CLINICAL INFORMATION/HISTORY: patient with cognitive decline, MOCA 20/ 30, MMSE 27/ 30 .   ATN positive , no excessive sleepiness not very fatigued but having a known co-morbidity of COPD. HST was ordered to make sure she is not hypoxic in sleep.      Epworth sleepiness score: 2/24.   BMI:  20.6 kg/m   Neck Circumference: 13"   FINDINGS:   Sleep Summary:   Total Recording Time (hours, min):     8 hours 30 minutes   Total Sleep Time (hours, min):     6 hours 29 minutes            Percent REM (%):     7.8%                                   Respiratory Indices:   Calculated pAHI (per hour):    Following CMS criteria the overall AHI was 3.2/h                         REM pAHI:     20/h                                            NREM pAHI:  1.8/h  This patient slept 335 minutes in nonsupine sleep associated with an AHI of 1.3 and only 54 minutes on her back in supine sleep with the AHI reached 14.9/h.  This is a very supine dependent sleep apnea.  Snoring reached a mean volume of 41 dB and was present for about 20% of total recorded sleep time. Oxygen Saturation Statistics:   Oxygen Saturation (%) Mean:     93%          O2 Saturation Range (%):   Between the nadir at 88% and a maximum saturation of 97%.                                    O2 Saturation (minutes) <89%:     Less than 1 minute      Pulse Rate Statistics:   Pulse Mean (bpm):    52 bpm             Pulse Range:        From 43 bpm to a maximum of 75 bpm.         IMPRESSION:  This HST confirms that sleep apnea is not present.  There was no significant hypoxemia noted.  There was intermittent bradycardia seen which can be medication related.     RECOMMENDATION: Follow-up for initiating treatment of a sleep disorder is  not needed.  This patient does not have obstructive sleep apnea nor hypoxia.  Central apneas were also not seen. I would recommend to avoid sleep supine sleep position and general as this seems to have exacerbated snoring.    INTERPRETING PHYSICIAN:   Melvyn Novas, MD

## 2023-07-16 ENCOUNTER — Other Ambulatory Visit: Payer: Self-pay | Admitting: Cardiology

## 2023-07-16 DIAGNOSIS — I4819 Other persistent atrial fibrillation: Secondary | ICD-10-CM

## 2023-07-16 NOTE — Telephone Encounter (Signed)
 Prescription refill request for Eliquis received. Indication:afib Last office visit:2/25 Scr:0.63  9/24 Age: 74 Weight:58.4  kg  Prescription refilled

## 2023-07-30 ENCOUNTER — Other Ambulatory Visit: Payer: Self-pay | Admitting: Cardiology

## 2023-09-25 NOTE — Progress Notes (Unsigned)
 [facility] [address] [place]. [place] [address] [phone]       OFFICE FOLLOW UP NOTE  Ms. Anita Donovan Date of Birth:  January 29, 1951 Medical Record Number:  [id number]    Primary neurologist: Dr. Chalice Reason for visit: Memory loss    SUBJECTIVE:  CHIEF COMPLAINT:  No chief complaint on file.   Follow-up visit:  Prior visit: 04/08/2023 with Dr. Chalice (initial consult visit)  Brief HPI:   Anita Donovan is a 73 y.o. female who was evaluated by Dr. Chalice in 04/2023 for memory concerns with strong family history of dementia (2 out of 4 sisters, both parents).  Noted short-term memory difficulties and delayed recall, can get stuck midsentence.  ATN profile previously completed which was positive.  MoCA 20/30, MMSE 27/30.  Recommended further lab work as well as completion of brain imaging and neuropsychology evaluation.  If MRI brain largely unremarkable, recommended pursuing with PET scan to consider antiamyloid therapy.  Also recommended completion of HST to rule out underlying sleep apnea with underlying history of A-fib.  APOE resulted with E4/E4 MRI brain 05/2023 showed multiple chronic microhemorrhages most consistent with cerebral amyloid angiopathy as well as moderate chronic microvascular ischemic changes     Interval history:           ROS:   14 system review of systems performed and negative with exception of ***  PMH:  Past Medical History:  Diagnosis Date   Allergy    Arthritis    knee right   Cancer (HCC)    basal cell carcimona   Complication of anesthesia    slow to wake up suring colonoscopy   Multiple thyroid  nodules    Myocardial infarction [facility])    Osteoporosis    Persistent atrial fibrillation (HCC) 12/20/2020   SVT (supraventricular tachycardia) (HCC)     PSH:  Past Surgical History:  Procedure Laterality Date   CARDIOVERSION N/A 12/20/2020   Procedure: CARDIOVERSION;  Surgeon: Raford Riggs, MD;  Location: [facility] ENDOSCOPY;  Service: Cardiovascular;  Laterality: N/A;   COLONOSCOPY     IR RADIOLOGIST EVAL & MGMT  09/21/2021   LAPAROSCOPIC APPENDECTOMY N/A 10/12/2021   Procedure: LAPAROSCOPIC APPENDECTOMY;  Surgeon: Curvin Deward MOULD, MD;  Location: [facility] ORS;  Service: General;  Laterality: N/A;   LEFT HEART CATH AND CORONARY ANGIOGRAPHY N/A 04/05/2020   Procedure: LEFT HEART CATH AND CORONARY ANGIOGRAPHY;  Surgeon: Claudene Victory ORN, MD;  Location: [facility] INVASIVE CV LAB;  Service: Cardiovascular;  Laterality: N/A;   MOHS SURGERY     POLYPECTOMY     TONSILLECTOMY      Social History:  Social History   Socioeconomic History   Marital status: Widowed    Spouse name: Not on file   Number of children: Not on file   Years of education: Not on file   Highest education level: Not on file  Occupational History   Not on file  Tobacco Use   Smoking status: Former    Current packs/day: 0.00    Types: Cigarettes    Quit date: 04/08/2017    Years since quitting: 6.4   Smokeless tobacco: Never  Vaping Use   Vaping status: Never Used  Substance and Sexual Activity   Alcohol use: Not Currently    Comment: 2 glasses per night   Drug use: Never   Sexual activity: Not on file  Other Topics Concern   Not on file  Social History Narrative   Lives alone  Retired    Teacher, early years/pre Strain: Low Risk  (06/15/2023)   Received from [employer]   Overall Financial Resource Strain (CARDIA)    Difficulty of Paying Living Expenses: Not hard at all  Food Insecurity: No Food Insecurity (06/15/2023)   Received from [facility]   Hunger Vital Sign    Within the past 12 months, you worried that your food would run out before you got the money to buy more.: Never true    Within the past 12 months, the food you bought just didn't last and you didn't have money to get more.: Never true  Transportation Needs: No Transportation Needs (06/15/2023)   Received from  [facility] - Transportation    Lack of Transportation (Medical): No    Lack of Transportation (Non-Medical): No  Physical Activity: Insufficiently Active (06/15/2023)   Received from [facility]   Exercise Vital Sign    On average, how many days per week do you engage in moderate to strenuous exercise (like a brisk walk)?: 3 days    On average, how many minutes do you engage in exercise at this level?: 30 min  Stress: No Stress Concern Present (06/15/2023)   Received from [facility] - Occupational Stress Questionnaire    Feeling of Stress : Not at all  Social Connections: Socially Integrated (06/15/2023)   Received from [facility]   Social Network    How would you rate your social network (family, work, friends)?: Good participation with social networks  Intimate Partner Violence: Not At Risk (06/15/2023)   Received from [facility]   HITS    Over the last 12 months how often did your partner physically hurt you?: Never    Over the last 12 months how often did your partner insult you or talk down to you?: Never    Over the last 12 months how often did your partner threaten you with physical harm?: Never    Over the last 12 months how often did your partner scream or curse at you?: Never    Family History:  Family History  Problem Relation Age of Onset   Heart disease Mother        Stents   Alzheimer's disease Mother    Alzheimer's disease Father    Dementia Sister    Alzheimer's disease Sister    Colon cancer Neg Hx    Colon polyps Neg Hx    Esophageal cancer Neg Hx    Rectal cancer Neg Hx    Stomach cancer Neg Hx     Medications:   Current Outpatient Medications on File Prior to Visit  Medication Sig Dispense Refill   acetaminophen  (TYLENOL ) 325 MG tablet Take 325 mg by mouth every 6 (six) hours as needed for moderate pain.     alendronate  (FOSAMAX ) 70 MG tablet Take 70 mg by mouth every Monday.  3    ALPRAZolam  (XANAX ) 0.5 MG tablet Take 1 tablet (0.5 mg total) by mouth at bedtime as needed for anxiety (MRI premedication). (Patient not taking: Reported on 05/14/2023) 2 tablet 0   b complex vitamins capsule Take 1 capsule by mouth daily.     Calcium Carbonate-Vitamin D (CALTRATE 600+D PO) Take 1 capsule by mouth daily.     carvedilol  (COREG ) 6.25 MG tablet TAKE 1 TABLET(6.25 MG) BY MOUTH TWICE DAILY 180 tablet 1   diclofenac Sodium (VOLTAREN) 1 % GEL Apply 1  Application topically 4 (four) times daily as needed (pain).     ELIQUIS  5 MG TABS tablet TAKE 1 TABLET(5 MG) BY MOUTH TWICE DAILY 60 tablet 5   fluticasone  (FLONASE) 50 MCG/ACT nasal spray Place 1 spray into both nostrils daily as needed for allergies or rhinitis.     levothyroxine  (SYNTHROID ) 50 MCG tablet Take 50 mcg by mouth daily before breakfast.     metoprolol  succinate (TOPROL -XL) 25 MG 24 hr tablet Take 25 mg by mouth daily as needed (rapid heart rate). (Patient not taking: Reported on 05/14/2023)     Multiple Vitamins-Minerals (CENTRUM SILVER ADULT 50+ PO) Take 1 tablet by mouth daily.     nitroGLYCERIN  (NITROSTAT ) 0.4 MG SL tablet Place 1 tablet (0.4 mg total) under the tongue every 5 (five) minutes as needed for chest pain. (Patient not taking: Reported on 05/14/2023) 25 tablet 3   ofloxacin (OCUFLOX) 0.3 % ophthalmic solution Place 1 drop into the left eye 4 (four) times daily. (Patient not taking: Reported on 05/14/2023)     prednisoLONE acetate (PRED FORTE) 1 % ophthalmic suspension Place 1 drop into the left eye 4 (four) times daily.     TRELEGY ELLIPTA  100-62.5-25 MCG/ACT AEPB INHALE 1 PUFF INTO THE LUNGS DAILY 60 each 0   No current facility-administered medications on file prior to visit.    Allergies:  No Known Allergies    OBJECTIVE:  Physical Exam  There were no vitals filed for this visit. There is no height or weight on file to calculate BMI. No results found.   General: well developed, well nourished,  seated, in no evident distress Head: head normocephalic and atraumatic.   Neck: supple with no carotid or supraclavicular bruits Cardiovascular: regular rate and rhythm, no murmurs Musculoskeletal: no deformity Skin:  no rash/petichiae Vascular:  Normal pulses all extremities   Neurologic Exam Mental Status: Awake and fully alert. Oriented to place and time. Recent and remote memory intact. Attention span, concentration and fund of knowledge appropriate. Mood and affect appropriate.  Cranial Nerves: Pupils equal, briskly reactive to light. Extraocular movements full without nystagmus. Visual fields full to confrontation. Hearing intact. Facial sensation intact. Face, tongue, palate moves normally and symmetrically.  Motor: Normal bulk and tone. Normal strength in all tested extremity muscles Sensory.: intact to touch , pinprick , position and vibratory sensation.  Coordination: Rapid alternating movements normal in all extremities. Finger-to-nose and heel-to-shin performed accurately bilaterally. Gait and Station: Arises from chair without difficulty. Stance is normal. Gait demonstrates normal stride length and balance with ***. Tandem walk and heel toe ***.  Reflexes: 1+ and symmetric. Toes downgoing.      04/08/2023    1:32 PM  Montreal Cognitive Assessment   Visuospatial/ Executive (0/5) 3  Naming (0/3) 3  Attention: Read list of digits (0/2) 1  Attention: Read list of letters (0/1) 1  Attention: Serial 7 subtraction starting at 100 (0/3) 2  Language: Repeat phrase (0/2) 1  Language : Fluency (0/1) 1  Abstraction (0/2) 2  Delayed Recall (0/5) 0  Orientation (0/6) 6  Total 20         ASSESSMENT/PLAN: Anita Donovan is a 73 y.o. year old female with PMH including but not limited to A-fib on Eliquis  s/p cardioversion, hx of EtOH and tobacco use (3-4 year cessation), hx of MI, SVT and HTN.       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     Harlene Bogaert, AGNP-BC  [facility] [address] [place], [place] [address]  Phone [phone] Fax [phone] Note: This document was prepared with digital dictation and possible smart phrase technology. Any transcriptional errors that result from this process are unintentional.

## 2023-09-26 ENCOUNTER — Encounter: Payer: Self-pay | Admitting: Adult Health

## 2023-09-26 ENCOUNTER — Ambulatory Visit: Payer: Medicare HMO | Admitting: Adult Health

## 2023-09-26 VITALS — BP 132/64 | HR 69 | Ht 66.0 in | Wt 127.0 lb

## 2023-09-26 DIAGNOSIS — E854 Organ-limited amyloidosis: Secondary | ICD-10-CM

## 2023-09-26 DIAGNOSIS — G3184 Mild cognitive impairment, so stated: Secondary | ICD-10-CM

## 2023-09-26 DIAGNOSIS — G309 Alzheimer's disease, unspecified: Secondary | ICD-10-CM

## 2023-09-26 NOTE — Patient Instructions (Addendum)
 Your Plan:   Please monitor memory and please let me know if this should worsen or interested in starting any medication to help slow the decline such as Aricept or Namenda       Follow up in 6 months or call earlier if needed      Thank you for coming to see us  at [facility]. I hope we have been able to provide you high quality care today.  You may receive a patient satisfaction survey over the next few weeks. We would appreciate your feedback and comments so that we may continue to improve ourselves and the health of our patients.

## 2023-10-18 NOTE — Progress Notes (Signed)
 Given the high genetic burden and positive ATN  biomarker panel  and the exclusion from MAB therapy, I would strongly recommend to utilize Namenda and Aricept.  The MMSE at 20 and 22/ 30 is no longer MCI but is clearly reflecting Dementia , AD .  Anita Gores, MD

## 2023-10-30 ENCOUNTER — Other Ambulatory Visit: Payer: Self-pay | Admitting: Cardiology

## 2024-02-08 ENCOUNTER — Other Ambulatory Visit: Payer: Self-pay | Admitting: Cardiology

## 2024-02-25 ENCOUNTER — Telehealth: Payer: Self-pay | Admitting: Cardiology

## 2024-02-25 DIAGNOSIS — I4819 Other persistent atrial fibrillation: Secondary | ICD-10-CM

## 2024-02-25 MED ORDER — APIXABAN 5 MG PO TABS
5.0000 mg | ORAL_TABLET | Freq: Two times a day (BID) | ORAL | 1 refills | Status: AC
Start: 2024-02-25 — End: ?

## 2024-02-25 NOTE — Telephone Encounter (Signed)
 Pt last saw Dr Pietro 05/14/23, last labs 12/19/23 Creat 0.69, age 73, weight 57.6kg, based on specified criteria pt is on appropriate dosage of Eliquis  5mg  BID for afib.  Will refill rx.

## 2024-02-25 NOTE — Telephone Encounter (Signed)
*  STAT* If patient is at the pharmacy, call can be transferred to refill team.   1. Which medications need to be refilled? (please list name of each medication and dose if known)  ELIQUIS  5 MG TABS tablet    2. Which pharmacy/location (including street and city if local pharmacy) is medication to be sent to? [id number] - [place], [place] - [address]   3. Do they need a 30 day or 90 day supply?  90 day supply

## 2024-04-16 NOTE — Progress Notes (Signed)
 " [facility] [address] [place]. [place] [address] [phone]       OFFICE FOLLOW UP NOTE  Ms. Anita Donovan Date of Birth:  [birth year] Medical Record Number:  [id number]    Primary neurologist: Dr. Chalice Reason for visit: Memory loss    SUBJECTIVE:  CHIEF COMPLAINT:  Chief Complaint  Patient presents with   Follow-up    Patient in room 8 alone Patient is here for a memory follow up. Patient states memory is about the same since last visit.  21/30 MOCA    Follow-up visit:  Prior visit: 09/26/2023  Brief HPI:   Anita Donovan is a 74 y.o. female who was evaluated by Dr. Chalice in 04/2023 for memory concerns with strong family history of dementia (2 out of 4 sisters, both parents).  Noted short-term memory difficulties and delayed recall, can get stuck midsentence.  ATN profile previously completed which was positive.  MoCA 20/30, MMSE 27/30.  Recommended further lab work as well as completion of brain imaging and neuropsychology evaluation.  If MRI brain largely unremarkable, recommended pursuing with PET scan to consider antiamyloid therapy.  Also recommended completion of HST to rule out underlying sleep apnea with underlying history of A-fib.  APOE resulted with E4/E4 MRI brain 05/2023 showed multiple chronic microhemorrhages most consistent with cerebral amyloid angiopathy as well as moderate chronic microvascular ischemic changes HST 06/2023 negative for sleep apnea or hypoxemia  At prior visit, reported memory overall stable.  MoCA 22/30, previously 20/30.  Declined interest in initiating medication such as Aricept or Namenda    Interval history:  Patient returns for follow-up visit.  Reports overall doing well since prior visit.  Denies any significant changes in memory.  Can have occasional bad days but not often. Moca today 21/30.  Continues to maintain ADLs and IADLs independently as well as driving without difficulty.  She questions  benefit with Prevagen.  She recently started participating in chair yoga which she has been enjoying as well as other various activities at her gym.  She continues to follow routinely with PCP and cardiology.  No further questions or concerns at this time.       ROS:   14 system review of systems performed and negative with exception of those listed in HPI  PMH:  Past Medical History:  Diagnosis Date   Allergy    Arthritis    knee right   Cancer (HCC)    basal cell carcimona   Complication of anesthesia    slow to wake up suring colonoscopy   Multiple thyroid  nodules    Myocardial infarction [facility])    Osteoporosis    Persistent atrial fibrillation (HCC) 12/20/2020   SVT (supraventricular tachycardia)     PSH:  Past Surgical History:  Procedure Laterality Date   CARDIOVERSION N/A 12/20/2020   Procedure: CARDIOVERSION;  Surgeon: Raford Riggs, MD;  Location: [facility] ENDOSCOPY;  Service: Cardiovascular;  Laterality: N/A;   COLONOSCOPY     IR RADIOLOGIST EVAL & MGMT  09/21/2021   LAPAROSCOPIC APPENDECTOMY N/A 10/12/2021   Procedure: LAPAROSCOPIC APPENDECTOMY;  Surgeon: Curvin Deward MOULD, MD;  Location: [facility] ORS;  Service: General;  Laterality: N/A;   LEFT HEART CATH AND CORONARY ANGIOGRAPHY N/A 04/05/2020   Procedure: LEFT HEART CATH AND CORONARY ANGIOGRAPHY;  Surgeon: Claudene Victory ORN, MD;  Location: [facility] INVASIVE CV LAB;  Service: Cardiovascular;  Laterality: N/A;   MOHS SURGERY     POLYPECTOMY     TONSILLECTOMY  Social History:  Social History   Socioeconomic History   Marital status: Widowed    Spouse name: Not on file   Number of children: Not on file   Years of education: Not on file   Highest education level: Not on file  Occupational History   Not on file  Tobacco Use   Smoking status: Former    Current packs/day: 0.00    Types: Cigarettes    Quit date: 04/08/2017    Years since quitting: 7.0   Smokeless tobacco: Never  Vaping Use   Vaping status: Never Used   Substance and Sexual Activity   Alcohol use: Not Currently    Comment: 2 glasses per night   Drug use: Never   Sexual activity: Not on file  Other Topics Concern   Not on file  Social History Narrative   Lives alone    Retired    Social Drivers of Health   Tobacco Use: Medium Risk (04/20/2024)   Patient History    Smoking Tobacco Use: Former    Smokeless Tobacco Use: Never    Passive Exposure: Not on file  Financial Resource Strain: Low Risk (12/17/2023)   Received from [facility]   Overall Financial Resource Strain (CARDIA)    How hard is it for you to pay for the very basics like food, housing, medical care, and heating?: Not hard at all  Food Insecurity: No Food Insecurity (12/17/2023)   Received from [facility]   Epic    Within the past 12 months, you worried that your food would run out before you got the money to buy more.: Never true    Within the past 12 months, the food you bought just didn't last and you didn't have money to get more.: Never true  Transportation Needs: No Transportation Needs (12/17/2023)   Received from [facility]    In the past 12 months, has lack of transportation kept you from medical appointments or from getting medications?: No    In the past 12 months, has lack of transportation kept you from meetings, work, or from getting things needed for daily living?: No  Physical Activity: Insufficiently Active (12/17/2023)   Received from [facility]   Exercise Vital Sign    On average, how many days per week do you engage in moderate to strenuous exercise (like a brisk walk)?: 3 days    On average, how many minutes do you engage in exercise at this level?: 30 min  Stress: No Stress Concern Present (12/17/2023)   Received from [facility] of Occupational Health - Occupational Stress Questionnaire    Do you feel stress - tense, restless, nervous, or anxious, or unable to sleep at night because your mind is troubled all  the time - these days?: Not at all  Social Connections: Socially Integrated (12/17/2023)   Received from [facility]   Social Network    How would you rate your social network (family, work, friends)?: Good participation with social networks  Intimate Partner Violence: Not At Risk (12/17/2023)   Received from [facility]   HITS    Over the last 12 months how often did your partner physically hurt you?: Never    Over the last 12 months how often did your partner insult you or talk down to you?: Never    Over the last 12 months how often did your partner threaten you with physical harm?: Never    Over the  last 12 months how often did your partner scream or curse at you?: Never  Depression (PHQ2-9): Not on file  Alcohol Screen: Not on file  Housing: Low Risk (12/17/2023)   Received from [facility]    In the last 12 months, was there a time when you were not able to pay the mortgage or rent on time?: No    In the past 12 months, how many times have you moved where you were living?: 0    At any time in the past 12 months, were you homeless or living in a shelter (including now)?: No  Utilities: Not At Risk (12/17/2023)   Received from [facility]    In the past 12 months has the electric, gas, oil, or water company threatened to shut off services in your home?: No  Health Literacy: Not on file    Family History:  Family History  Problem Relation Age of Onset   Heart disease Mother        Stents   Alzheimer's disease Mother    Alzheimer's disease Father    Dementia Sister    Alzheimer's disease Sister    Colon cancer Neg Hx    Colon polyps Neg Hx    Esophageal cancer Neg Hx    Rectal cancer Neg Hx    Stomach cancer Neg Hx     Medications:   Current Outpatient Medications on File Prior to Visit  Medication Sig Dispense Refill   acetaminophen  (TYLENOL ) 325 MG tablet Take 325 mg by mouth every 6 (six) hours as needed for moderate pain.     alendronate   (FOSAMAX ) 70 MG tablet Take 70 mg by mouth every Monday.  3   apixaban  (ELIQUIS ) 5 MG TABS tablet Take 1 tablet (5 mg total) by mouth 2 (two) times daily. 180 tablet 1   Calcium Carbonate-Vitamin D (CALTRATE 600+D PO) Take 1 capsule by mouth daily.     carvedilol  (COREG ) 6.25 MG tablet TAKE 1 TABLET(6.25 MG) BY MOUTH TWICE DAILY 180 tablet 0   co-enzyme Q-10 30 MG capsule Take 30 mg by mouth 2 (two) times daily.     diclofenac Sodium (VOLTAREN) 1 % GEL Apply 1 Application topically 4 (four) times daily as needed (pain).     fluticasone  (FLONASE) 50 MCG/ACT nasal spray Place 1 spray into both nostrils daily as needed for allergies or rhinitis.     levothyroxine  (SYNTHROID ) 50 MCG tablet Take 50 mcg by mouth daily before breakfast.     metoprolol  succinate (TOPROL -XL) 25 MG 24 hr tablet Take 25 mg by mouth daily as needed (rapid heart rate).     Multiple Vitamins-Minerals (CENTRUM SILVER ADULT 50+ PO) Take 1 tablet by mouth daily.     nitroGLYCERIN  (NITROSTAT ) 0.4 MG SL tablet Place 1 tablet (0.4 mg total) under the tongue every 5 (five) minutes as needed for chest pain. 25 tablet 3   TRELEGY ELLIPTA  100-62.5-25 MCG/ACT AEPB INHALE 1 PUFF INTO THE LUNGS DAILY 60 each 0   No current facility-administered medications on file prior to visit.    Allergies:  No Known Allergies    OBJECTIVE:  Physical Exam  Vitals:   04/20/24 1351 04/20/24 1407  BP: (!) 143/62 139/72  Pulse: (!) 54 (!) 53  Weight: 127 lb 12.8 oz (58 kg)   Height: 5' 10 (1.778 m)    Body mass index is 18.34 kg/m. No results found.  General: well developed, well nourished, very pleasant elderly  Caucasian female, seated, in no evident distress Head: head normocephalic and atraumatic.   Neck: supple with no carotid or supraclavicular bruits Cardiovascular: regular rate and rhythm, no murmurs  Neurologic Exam Mental Status: Awake and fully alert.  Fluent speech and language.  Oriented to place and time. Recent memory  mildly impaired and remote memory intact. Attention span, concentration and fund of knowledge appropriate. Mood and affect appropriate.  Cranial Nerves: Pupils equal, briskly reactive to light. Extraocular movements full without nystagmus. Visual fields full to confrontation. Hearing intact. Facial sensation intact. Face, tongue, palate moves normally and symmetrically.  Motor: Normal bulk and tone. Normal strength in all tested extremity muscles Gait and Station: Arises from chair without difficulty. Stance is normal. Gait demonstrates normal stride length and balance without use of AD.      04/20/2024    1:54 PM 09/26/2023    1:51 PM 04/08/2023    1:32 PM  Montreal Cognitive Assessment   Visuospatial/ Executive (0/5) 4 3 3   Naming (0/3) 3 3 3   Attention: Read list of digits (0/2) 1 1 1   Attention: Read list of letters (0/1) 1 1 1   Attention: Serial 7 subtraction starting at 100 (0/3) 1 2 2   Language: Repeat phrase (0/2) 2 1 1   Language : Fluency (0/1) 1 1 1   Abstraction (0/2) 2 2 2   Delayed Recall (0/5) 0 2 0  Orientation (0/6) 6 6 6   Total 21 22 20          ASSESSMENT/PLAN: Anita Donovan is a 74 y.o. year old female with PMH including but not limited to A-fib on Eliquis  s/p cardioversion, hx of EtOH and tobacco use (3-4 year cessation), hx of MI, SVT and HTN.       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  Continues to decline interest in [place].  Do not recommend use of Aricept due to bradycardia. APOE e4/e4 ATN profile consistent with Alzheimer's pathology MRI brain concerning for cerebral amyloid angiopathy  Paroxysmal atrial fibrillation Remains on Eliquis  per cardiology recommendations.   Previously discussed concern regarding use of Eliquis  in setting of possible CAA.  She has follow-up visit scheduled with cardiology next month, question if good candidate for Watchman device, further discuss with cardiology next month as scheduled      No further recommendations from neurological standpoint and can return back to PCP for ongoing monitoring and management.  She was advised to call with any questions or concerns in the future.  She verbalized understanding and agrees with plan.   CC:  PCP: Pura Lenis, MD     Harlene Bogaert, AGNP-BC  [facility] [address] [place], [place] [address]  Phone [phone] Fax [phone] Note: This document was prepared with digital dictation and possible smart phrase technology. Any transcriptional errors that result from this process are unintentional.  "

## 2024-04-19 NOTE — Progress Notes (Unsigned)
 "     [name], PA-C [address] [place], [place]  [address] Phone: [phone]   Primary Care Physician: Pura Lenis, MD  Primary Gastroenterologist:  [name], PA-C / Dr. Gordy Starch   Chief Complaint: Discuss repeat colonoscopy; history of colon polyps      HPI:   Discussed the use of AI scribe software for clinical note transcription with the patient, who gave verbal consent to proceed.  12/2020 last colonoscopy by Dr. Starch: 6 tubular adenoma polyps (2 mm to 6 mm) removed.  Good prep.  3-year repeat (due 02/2024).  History of Present Illness Anita Donovan is a 74 year old female with colonic polyps and atrial fibrillation on anticoagulation who presents for routine follow-up and scheduling of repeat colonoscopy.  Colonic Polyps Surveillance: - Last colonoscopy in October 2022 with removal of six small polyps - No rectal bleeding, constipation, diarrhea, abdominal pain, or heartburn - No symptoms or concerns today.  Atrial Fibrillation Management: - Paroxysmal atrial fibrillation managed with Eliquis  - Eliquis  previously held prior to last colonoscopy per instructions - Prior episode of atrial fibrillation resulted in feeling unwell for one week - No current symptoms of atrial fibrillation  Functional Status and Lifestyle: - Maintains a healthy lifestyle - Avoids alcohol - Remains active caring for two Australian Shepherd dogs  PMH: A-fib, SVT, on Eliquis .  Cardiologist Dr. Pietro.  Cardioversion 12/2020.  LVEF 60 to 65%.  Mild cognitive impairment.  08/2017 colonoscopy:  6 tubular adenoma polyps removed (4 mm to 12 mm).  3-year repeat.  Current Outpatient Medications  Medication Sig Dispense Refill   acetaminophen  (TYLENOL ) 325 MG tablet Take 325 mg by mouth every 6 (six) hours as needed for moderate pain.     alendronate  (FOSAMAX ) 70 MG tablet Take 70 mg by mouth every Monday.  3   apixaban  (ELIQUIS ) 5 MG TABS tablet Take 1 tablet (5 mg total) by  mouth 2 (two) times daily. 180 tablet 1   Apoaequorin (PREVAGEN PO) Take by mouth. One daily     Calcium Carbonate-Vitamin D (CALTRATE 600+D PO) Take 1 capsule by mouth daily.     carvedilol  (COREG ) 6.25 MG tablet TAKE 1 TABLET(6.25 MG) BY MOUTH TWICE DAILY 180 tablet 0   co-enzyme Q-10 30 MG capsule Take 30 mg by mouth 2 (two) times daily.     diclofenac Sodium (VOLTAREN) 1 % GEL Apply 1 Application topically 4 (four) times daily as needed (pain).     fluticasone  (FLONASE) 50 MCG/ACT nasal spray Place 1 spray into both nostrils daily as needed for allergies or rhinitis.     levothyroxine  (SYNTHROID ) 50 MCG tablet Take 50 mcg by mouth daily before breakfast.     metoprolol  succinate (TOPROL -XL) 25 MG 24 hr tablet Take 25 mg by mouth daily as needed (rapid heart rate).     Multiple Vitamins-Minerals (CENTRUM SILVER ADULT 50+ PO) Take 1 tablet by mouth daily.     Na Sulfate-K Sulfate-Mg Sulfate concentrate (SUPREP) 17.5-3.13-1.6 GM/177ML SOLN Take 1 kit (354 mLs total) by mouth once for 1 dose. 354 mL 0   nitroGLYCERIN  (NITROSTAT ) 0.4 MG SL tablet Place 1 tablet (0.4 mg total) under the tongue every 5 (five) minutes as needed for chest pain. 25 tablet 3   TRELEGY ELLIPTA  100-62.5-25 MCG/ACT AEPB INHALE 1 PUFF INTO THE LUNGS DAILY 60 each 0   No current facility-administered medications for this visit.    Allergies as of 04/23/2024   (No Known Allergies)  Past Medical History:  Diagnosis Date   Allergy    Arthritis    knee right   Cancer (HCC)    basal cell carcimona   Complication of anesthesia    slow to wake up suring colonoscopy   Multiple thyroid  nodules    Myocardial infarction [facility])    Osteoporosis    Persistent atrial fibrillation (HCC) 12/20/2020   SVT (supraventricular tachycardia)     Past Surgical History:  Procedure Laterality Date   CARDIOVERSION N/A 12/20/2020   Procedure: CARDIOVERSION;  Surgeon: Raford Riggs, MD;  Location: [facility] ENDOSCOPY;  Service:  Cardiovascular;  Laterality: N/A;   COLONOSCOPY     IR RADIOLOGIST EVAL & MGMT  09/21/2021   LAPAROSCOPIC APPENDECTOMY N/A 10/12/2021   Procedure: LAPAROSCOPIC APPENDECTOMY;  Surgeon: Curvin Deward MOULD, MD;  Location: [facility] ORS;  Service: General;  Laterality: N/A;   LEFT HEART CATH AND CORONARY ANGIOGRAPHY N/A 04/05/2020   Procedure: LEFT HEART CATH AND CORONARY ANGIOGRAPHY;  Surgeon: Claudene Victory ORN, MD;  Location: [facility] INVASIVE CV LAB;  Service: Cardiovascular;  Laterality: N/A;   MOHS SURGERY     POLYPECTOMY     TONSILLECTOMY      Review of Systems:    All systems reviewed and negative except where noted in HPI.    Physical Exam:  BP 100/60   Pulse (!) 57   Ht 5' 5.75 (1.67 m)   Wt 126 lb (57.2 kg)   BMI 20.49 kg/m  No LMP recorded. Patient is postmenopausal.  General: Well-nourished, well-developed in no acute distress.  Lungs: Clear to auscultation bilaterally. Non-labored. Heart: Regular rate and rhythm, no murmurs rubs or gallops.  Not in A-fib today. Abdomen: Bowel sounds are normal; Abdomen is Soft; No hepatosplenomegaly, masses or hernias;  No Abdominal Tenderness; No guarding or rebound tenderness. Neuro: Alert and oriented x 3.  Grossly intact.  Psych: Alert and cooperative, normal mood and affect.   Imaging Studies: No results found.  Labs: CBC    Component Value Date/Time   WBC 4.8 10/05/2021 1314   RBC 3.83 (L) 10/05/2021 1314   HGB 12.0 10/05/2021 1314   HGB 13.1 12/12/2020 1130   HCT 36.9 10/05/2021 1314   HCT 39.3 12/12/2020 1130   PLT 241 10/05/2021 1314   PLT 244 12/12/2020 1130   MCV 96.3 10/05/2021 1314   MCV 96 12/12/2020 1130   MCH 31.3 10/05/2021 1314   MCHC 32.5 10/05/2021 1314   RDW 12.7 10/05/2021 1314   RDW 11.3 (L) 12/12/2020 1130   LYMPHSABS 0.8 11/12/2020 1026   MONOABS 0.5 11/12/2020 1026   EOSABS 0.1 11/12/2020 1026   BASOSABS 0.0 11/12/2020 1026    CMP     Component Value Date/Time   NA 138 10/05/2021 1314   NA 135 12/12/2020  1130   K 4.2 10/05/2021 1314   CL 106 10/05/2021 1314   CO2 26 10/05/2021 1314   GLUCOSE 100 (H) 10/05/2021 1314   BUN 20 10/05/2021 1314   BUN 17 12/12/2020 1130   CREATININE 0.62 10/05/2021 1314   CREATININE 0.79 02/12/2014 1521   CALCIUM 9.3 10/05/2021 1314   PROT 7.7 08/05/2021 1140   ALBUMIN 4.1 08/05/2021 1140   AST 24 08/05/2021 1140   ALT 22 08/05/2021 1140   ALKPHOS 58 08/05/2021 1140   BILITOT 1.9 (H) 08/05/2021 1140   GFRNONAA >60 10/05/2021 1314   GFRNONAA 79 02/12/2014 1521   GFRAA >89 02/12/2014 1521     Assessment and Plan:   Earnie JAYSON Berg  is a 74 y.o. y/o female presents for:  1.  History of colon polyps - Scheduling Colonoscopy I discussed risks of colonoscopy with patient to include risk of bleeding, colon perforation, and risk of sedation.  Patient expressed understanding and agrees to proceed with colonoscopy.   2.  Comorbidities:  A-fib, SVT, on Eliquis .  Cardiologist Dr. Pietro.  Cardioversion 12/2020.  LVEF 60 to 65%. - Request cardiac permission to hold Eliquis  2 days prior to colonoscopy.   [name], PA-C  Follow up as needed based on colonoscopy results.   "

## 2024-04-20 ENCOUNTER — Ambulatory Visit: Admitting: Adult Health

## 2024-04-20 ENCOUNTER — Encounter: Payer: Self-pay | Admitting: Adult Health

## 2024-04-20 VITALS — BP 139/72 | HR 53 | Ht 70.0 in | Wt 127.8 lb

## 2024-04-20 DIAGNOSIS — I680 Cerebral amyloid angiopathy: Secondary | ICD-10-CM

## 2024-04-20 DIAGNOSIS — G309 Alzheimer's disease, unspecified: Secondary | ICD-10-CM

## 2024-04-20 DIAGNOSIS — I480 Paroxysmal atrial fibrillation: Secondary | ICD-10-CM

## 2024-04-20 DIAGNOSIS — E854 Organ-limited amyloidosis: Secondary | ICD-10-CM

## 2024-04-20 DIAGNOSIS — G3184 Mild cognitive impairment, so stated: Secondary | ICD-10-CM | POA: Diagnosis not present

## 2024-04-20 NOTE — Patient Instructions (Signed)
 Your Plan:  Try Prevagan and if needed, can consider trying Cerefolin instead - please let me know if you are interested in pursing this       Follow up as needed at this time      Thank you for coming to see us  at [facility]. I hope we have been able to provide you high quality care today.  You may receive a patient satisfaction survey over the next few weeks. We would appreciate your feedback and comments so that we may continue to improve ourselves and the health of our patients.

## 2024-04-23 ENCOUNTER — Encounter: Payer: Self-pay | Admitting: Physician Assistant

## 2024-04-23 ENCOUNTER — Telehealth: Payer: Self-pay

## 2024-04-23 ENCOUNTER — Ambulatory Visit: Admitting: Physician Assistant

## 2024-04-23 VITALS — BP 100/60 | HR 57 | Ht 65.75 in | Wt 126.0 lb

## 2024-04-23 DIAGNOSIS — I4891 Unspecified atrial fibrillation: Secondary | ICD-10-CM

## 2024-04-23 DIAGNOSIS — Z860100 Personal history of colon polyps, unspecified: Secondary | ICD-10-CM

## 2024-04-23 DIAGNOSIS — Z7901 Long term (current) use of anticoagulants: Secondary | ICD-10-CM | POA: Diagnosis not present

## 2024-04-23 DIAGNOSIS — I4819 Other persistent atrial fibrillation: Secondary | ICD-10-CM

## 2024-04-23 DIAGNOSIS — I471 Supraventricular tachycardia, unspecified: Secondary | ICD-10-CM

## 2024-04-23 MED ORDER — NA SULFATE-K SULFATE-MG SULF 17.5-3.13-1.6 GM/177ML PO SOLN: 1.0000 | Freq: Once | ORAL | 0 refills | Status: AC

## 2024-04-23 NOTE — Telephone Encounter (Signed)
 [facility] Pre-operative Risk Assessment     Request for surgical clearance:     Endoscopy Procedure  What type of surgery is being performed?     Colonoscopy  When is this surgery scheduled?     06/16/24  What type of clearance is required ?   Pharmacy  Are there any medications that need to be held prior to surgery and how long? Eliquis  for 2 days  Practice name and name of physician performing surgery?      [facility]  What is your office phone and fax number?      Phone- [phone]  Fax- [phone]  Anesthesia type (None, local, MAC, general) ?       MAC   Please route your response to [employer], RMA

## 2024-04-23 NOTE — Telephone Encounter (Signed)
 Please advise holding Eliquis  prior to colonoscopy on 06/16/24. Last labs 12/2023.  Thank you!  AW

## 2024-04-23 NOTE — Patient Instructions (Signed)
 You have been scheduled for a colonoscopy. Please follow written instructions given to you at your visit today.   If you use inhalers (even only as needed), please bring them with you on the day of your procedure.  DO NOT TAKE 7 DAYS PRIOR TO TEST- Trulicity (dulaglutide) Ozempic, Wegovy (semaglutide) Mounjaro, Zepbound (tirzepatide) Bydureon Bcise (exanatide extended release)  DO NOT TAKE 1 DAY PRIOR TO YOUR TEST Rybelsus (semaglutide) Adlyxin (lixisenatide) Victoza (liraglutide) Byetta (exanatide) ___________________________________________________________________________   Thank you for trusting me with your gastrointestinal care!   [name], PA-C  _______________________________________________________  If your blood pressure at your visit was 140/90 or greater, please contact your primary care physician to follow up on this.  _______________________________________________________  If you are age 57 or older, your body mass index should be between 23-30. Your Body mass index is 20.49 kg/m. If this is out of the aforementioned range listed, please consider follow up with your Primary Care Provider.  If you are age 17 or younger, your body mass index should be between 19-25. Your Body mass index is 20.49 kg/m. If this is out of the aformentioned range listed, please consider follow up with your Primary Care Provider.   ________________________________________________________  The [facility] providers would like to encourage you to use MYCHART to communicate with providers for non-urgent requests or questions.  Due to long hold times on the telephone, sending your provider a message by [facility] may be a faster and more efficient way to get a response.  Please allow 48 business hours for a response.  Please remember that this is for non-urgent requests.  _______________________________________________________  [facility] is using a team-based approach to care.   Your team is made up of your doctor and two to three APPS. Our APPS (Nurse Practitioners and Physician Assistants) work with your physician to ensure care continuity for you. They are fully qualified to address your health concerns and develop a treatment plan. They communicate directly with your gastroenterologist to care for you. Seeing the Advanced Practice Practitioners on your physician's team can help you by facilitating care more promptly, often allowing for earlier appointments, access to diagnostic testing, procedures, and other specialty referrals.

## 2024-04-27 NOTE — Telephone Encounter (Signed)
 Patient with diagnosis of atrial fibrillation on Eliquis  for anticoagulation.    Procedure: colonoscopy Date of procedure: 06/16/24   CHA2DS2-VASc Score = 3   This indicates a 3.2% annual risk of stroke. The patient's score is based upon: CHF History: 0 HTN History: 0 Diabetes History: 0 Stroke History: 0 Vascular Disease History: 0 Age Score: 2 Gender Score: 1    CrCl 64 Platelet count 431  Patient has not had an Afib/aflutter ablation in the last 3 months, DCCV within the last 4 weeks or a watchman implanted in the last 45 days   Per office protocol, patient can hold Eliquis  for 2 days prior to procedure.   Patient will not need bridging with Lovenox  (enoxaparin ) around procedure.  **This guidance is not considered finalized until pre-operative APP has relayed final recommendations.**

## 2024-04-28 NOTE — Telephone Encounter (Signed)
"  ° °  Patient Name: Anita Donovan  DOB: November 26, 1950 MRN: [id number]  Primary Cardiologist: Redell Shallow, MD  Chart reviewed as part of pre-operative protocol coverage. Patient has an upcoming colonoscopy scheduled, and we were asked to give our recommendations for holding Eliquis . Per Pharmacy and office protocol: Patient can hold Eliquis  for 2 days prior to procedure. Patient will not need bridging with Lovenox  (enoxaparin ) around procedure.  I will route this recommendation to the requesting party and remove from pre-op pool.  Please call with questions.  Alyssa Rotondo E Edom Schmuhl, PA-C 04/28/2024, 7:37 PM  "

## 2024-04-30 NOTE — Telephone Encounter (Signed)
 I spoke to Anita Donovan and I advised her that her cardiologist cleared her to hold her Eliquis  for 2 days prior to her procedure.

## 2024-05-07 NOTE — Progress Notes (Unsigned)
 "    HPI: FU atrial fibrillation. The patient was seen by Dr. Maye in June 2011 following an episode of palpitations requiring evaluation in the emergency room. There were apparently no rhythm strips available and he felt as though she may be having SVT. Abdominal ultrasound March 2016 showed no aneurysm. Admitted January 2022 with mildly elevated troponin and chest pain.  Echocardiogram January 2022 showed normal LV function.  Cardiac catheterization January 2022 showed vessel tortuosity but no obstructive coronary disease.  Symptoms felt possibly secondary to vasospasm. Previously seen in the office with palpitations and found to be in atrial fibrillation.  Patient had successful cardioversion on December 20, 2020.  Since last seen,   Current Outpatient Medications  Medication Sig Dispense Refill   acetaminophen  (TYLENOL ) 325 MG tablet Take 325 mg by mouth every 6 (six) hours as needed for moderate pain.     alendronate  (FOSAMAX ) 70 MG tablet Take 70 mg by mouth every Monday.  3   apixaban  (ELIQUIS ) 5 MG TABS tablet Take 1 tablet (5 mg total) by mouth 2 (two) times daily. 180 tablet 1   Apoaequorin (PREVAGEN PO) Take by mouth. One daily     Calcium Carbonate-Vitamin D (CALTRATE 600+D PO) Take 1 capsule by mouth daily.     carvedilol  (COREG ) 6.25 MG tablet TAKE 1 TABLET(6.25 MG) BY MOUTH TWICE DAILY 180 tablet 0   co-enzyme Q-10 30 MG capsule Take 30 mg by mouth 2 (two) times daily.     diclofenac Sodium (VOLTAREN) 1 % GEL Apply 1 Application topically 4 (four) times daily as needed (pain).     fluticasone  (FLONASE) 50 MCG/ACT nasal spray Place 1 spray into both nostrils daily as needed for allergies or rhinitis.     levothyroxine  (SYNTHROID ) 50 MCG tablet Take 50 mcg by mouth daily before breakfast.     metoprolol  succinate (TOPROL -XL) 25 MG 24 hr tablet Take 25 mg by mouth daily as needed (rapid heart rate).     Multiple Vitamins-Minerals (CENTRUM SILVER ADULT 50+ PO) Take 1 tablet by  mouth daily.     nitroGLYCERIN  (NITROSTAT ) 0.4 MG SL tablet Place 1 tablet (0.4 mg total) under the tongue every 5 (five) minutes as needed for chest pain. 25 tablet 3   TRELEGY ELLIPTA  100-62.5-25 MCG/ACT AEPB INHALE 1 PUFF INTO THE LUNGS DAILY 60 each 0   No current facility-administered medications for this visit.     Past Medical History:  Diagnosis Date   Allergy    Arthritis    knee right   Cancer (HCC)    basal cell carcimona   Complication of anesthesia    slow to wake up suring colonoscopy   Multiple thyroid  nodules    Myocardial infarction [facility])    Osteoporosis    Persistent atrial fibrillation (HCC) 12/20/2020   SVT (supraventricular tachycardia)     Past Surgical History:  Procedure Laterality Date   CARDIOVERSION N/A 12/20/2020   Procedure: CARDIOVERSION;  Surgeon: Raford Riggs, MD;  Location: [facility] ENDOSCOPY;  Service: Cardiovascular;  Laterality: N/A;   COLONOSCOPY     IR RADIOLOGIST EVAL & MGMT  09/21/2021   LAPAROSCOPIC APPENDECTOMY N/A 10/12/2021   Procedure: LAPAROSCOPIC APPENDECTOMY;  Surgeon: Curvin Deward MOULD, MD;  Location: [facility] ORS;  Service: General;  Laterality: N/A;   LEFT HEART CATH AND CORONARY ANGIOGRAPHY N/A 04/05/2020   Procedure: LEFT HEART CATH AND CORONARY ANGIOGRAPHY;  Surgeon: Claudene Victory ORN, MD;  Location: [facility] INVASIVE CV LAB;  Service: Cardiovascular;  Laterality: N/A;  MOHS SURGERY     POLYPECTOMY     TONSILLECTOMY      Social History   Socioeconomic History   Marital status: Widowed    Spouse name: Not on file   Number of children: 0   Years of education: Not on file   Highest education level: Not on file  Occupational History   Not on file  Tobacco Use   Smoking status: Former    Current packs/day: 0.00    Types: Cigarettes    Quit date: 04/08/2017    Years since quitting: 7.0   Smokeless tobacco: Never  Vaping Use   Vaping status: Never Used  Substance and Sexual Activity   Alcohol use: Not Currently    Comment: 2  glasses per night   Drug use: Never   Sexual activity: Not on file  Other Topics Concern   Not on file  Social History Narrative   Lives alone    Retired    Social Drivers of Health   Tobacco Use: Medium Risk (04/23/2024)   Patient History    Smoking Tobacco Use: Former    Smokeless Tobacco Use: Never    Passive Exposure: Not on file  Financial Resource Strain: Low Risk (12/17/2023)   Received from [facility]   Overall Financial Resource Strain (CARDIA)    How hard is it for you to pay for the very basics like food, housing, medical care, and heating?: Not hard at all  Food Insecurity: No Food Insecurity (12/17/2023)   Received from [facility]   Epic    Within the past 12 months, you worried that your food would run out before you got the money to buy more.: Never true    Within the past 12 months, the food you bought just didn't last and you didn't have money to get more.: Never true  Transportation Needs: No Transportation Needs (12/17/2023)   Received from [facility]    In the past 12 months, has lack of transportation kept you from medical appointments or from getting medications?: No    In the past 12 months, has lack of transportation kept you from meetings, work, or from getting things needed for daily living?: No  Physical Activity: Insufficiently Active (12/17/2023)   Received from [facility]   Exercise Vital Sign    On average, how many days per week do you engage in moderate to strenuous exercise (like a brisk walk)?: 3 days    On average, how many minutes do you engage in exercise at this level?: 30 min  Stress: No Stress Concern Present (12/17/2023)   Received from [facility] - Occupational Stress Questionnaire    Do you feel stress - tense, restless, nervous, or anxious, or unable to sleep at night because your mind is troubled all the time - these days?: Not at all  Social Connections: Socially Integrated  (12/17/2023)   Received from [facility]   Social Network    How would you rate your social network (family, work, friends)?: Good participation with social networks  Intimate Partner Violence: Not At Risk (12/17/2023)   Received from [facility]   HITS    Over the last 12 months how often did your partner physically hurt you?: Never    Over the last 12 months how often did your partner insult you or talk down to you?: Never    Over the last 12 months how often did your  partner threaten you with physical harm?: Never    Over the last 12 months how often did your partner scream or curse at you?: Never  Depression (PHQ2-9): Not on file  Alcohol Screen: Not on file  Housing: Low Risk (12/17/2023)   Received from [facility]    In the last 12 months, was there a time when you were not able to pay the mortgage or rent on time?: No    In the past 12 months, how many times have you moved where you were living?: 0    At any time in the past 12 months, were you homeless or living in a shelter (including now)?: No  Utilities: Not At Risk (12/17/2023)   Received from [facility]    In the past 12 months has the electric, gas, oil, or water company threatened to shut off services in your home?: No  Health Literacy: Not on file    Family History  Problem Relation Age of Onset   Heart disease Mother        Stents   Alzheimer's disease Mother    Alzheimer's disease Father    Dementia Sister    Alzheimer's disease Sister    Colon cancer Neg Hx    Colon polyps Neg Hx    Esophageal cancer Neg Hx    Rectal cancer Neg Hx    Stomach cancer Neg Hx     ROS: no fevers or chills, productive cough, hemoptysis, dysphasia, odynophagia, melena, hematochezia, dysuria, hematuria, rash, seizure activity, orthopnea, PND, pedal edema, claudication. Remaining systems are negative.  Physical Exam: Well-developed well-nourished in no acute distress.  Skin is warm and dry.  HEENT is normal.   Neck is supple.  Chest is clear to auscultation with normal expansion.  Cardiovascular exam is regular rate and rhythm.  Abdominal exam nontender or distended. No masses palpated. Extremities show no edema. neuro grossly intact  ECG- personally reviewed  A/P  1 paroxysmal atrial fibrillation-patient remains in sinus rhythm.  Continue carvedilol  at present dose.  Continue apixaban .  2 history of non-ST elevation myocardial infarction-catheterization at that time revealed no obstructive coronary disease and felt possibly secondary to vasospasm.  3 palpitations-symptoms are controlled.  Continue carvedilol  at present dose.  4 question history of SVT-this was not documented.  No recent palpitations.  Redell Shallow, MD    "

## 2024-05-19 ENCOUNTER — Ambulatory Visit: Admitting: Cardiology

## 2024-06-16 ENCOUNTER — Encounter: Admitting: Internal Medicine
